# Patient Record
Sex: Female | Born: 2000 | Race: Black or African American | Hispanic: No | Marital: Single | Smoking: Never smoker
Health system: Southern US, Community
[De-identification: ages and names within clinical notes are randomized; demographics above are authoritative.]

## PROBLEM LIST (undated history)

## (undated) ENCOUNTER — Inpatient Hospital Stay (HOSPITAL_COMMUNITY): Payer: Self-pay

## (undated) ENCOUNTER — Ambulatory Visit (HOSPITAL_COMMUNITY): Admission: EM | Payer: Medicaid Other | Source: Home / Self Care

## (undated) ENCOUNTER — Ambulatory Visit (HOSPITAL_COMMUNITY): Payer: Medicaid Other

## (undated) DIAGNOSIS — O139 Gestational [pregnancy-induced] hypertension without significant proteinuria, unspecified trimester: Secondary | ICD-10-CM

## (undated) HISTORY — PX: TONSILLECTOMY: SUR1361

---
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
## 2018-08-27 NOTE — L&D Delivery Note (Signed)
[facility]N [facility] Delivery Note  Tammie Byrd is a 18 y.o. G2P1001 s/p NSVD at [redacted]w[redacted]d. She was admitted for IOL for IUFD.   ROM: 0h 40m with clear fluid GBS Status: unknown Maximum Maternal Temperature: 100.3  Labor Progress: . Admitted for IOL 2/2 IUFD. Patient was given 6105mcg of cytotec at 1738. She then had SROM clear fluid at 1800. Shortly after that she went to use the bathroom and the FOB called out that the baby had been born.   Delivery Date/Time: 07/24/2019 at 1820 Delivery: Called to room and patient had just had NSVD of non-viable female in the bathroom. Cord clamped x 2, and cut by RN. Unable to draw cord blood. Placenta delivered spontaneously with gentle cord traction. Fundus firm with massage and Pitocin. Labia, perineum, vagina, and cervix inspected inspected with no lacerations found.   Placenta: Spontaneous, complete, intact  Complications: IUFD  Lacerations: None  EBL: 100 cc Analgesia: Fentanyl  Postpartum Planning [x]  message to sent to schedule follow-up    Infant: non-viable female  APGARs 0/0  Christiana DNP, CNM  07/24/19  7:17 PM

## 2018-09-14 ENCOUNTER — Emergency Department (HOSPITAL_COMMUNITY)
Admission: EM | Admit: 2018-09-14 | Discharge: 2018-09-14 | Disposition: A | Discharge status: Home / Self Care | Payer: Medicaid Other | Attending: Emergency Medicine | Admitting: Emergency Medicine

## 2018-09-14 ENCOUNTER — Encounter (HOSPITAL_COMMUNITY): Payer: Self-pay | Admitting: Emergency Medicine

## 2018-09-14 DIAGNOSIS — Z79899 Other long term (current) drug therapy: Secondary | ICD-10-CM | POA: Insufficient documentation

## 2018-09-14 DIAGNOSIS — E876 Hypokalemia: Secondary | ICD-10-CM | POA: Diagnosis not present

## 2018-09-14 DIAGNOSIS — F10929 Alcohol use, unspecified with intoxication, unspecified: Secondary | ICD-10-CM | POA: Diagnosis not present

## 2018-09-14 DIAGNOSIS — F10920 Alcohol use, unspecified with intoxication, uncomplicated: Secondary | ICD-10-CM

## 2018-09-14 LAB — CBC WITH DIFFERENTIAL/PLATELET
Abs Immature Granulocytes: 0.08 10*3/uL — ABNORMAL HIGH (ref 0.00–0.07)
BASOS PCT: 0 %
Basophils Absolute: 0 10*3/uL (ref 0.0–0.1)
EOS ABS: 0 10*3/uL (ref 0.0–0.5)
Eosinophils Relative: 0 %
HCT: 39.9 % (ref 36.0–46.0)
Hemoglobin: 11.9 g/dL — ABNORMAL LOW (ref 12.0–15.0)
Immature Granulocytes: 1 %
Lymphocytes Relative: 23 %
Lymphs Abs: 2.4 10*3/uL (ref 0.7–4.0)
MCH: 26.3 pg (ref 26.0–34.0)
MCHC: 29.8 g/dL — ABNORMAL LOW (ref 30.0–36.0)
MCV: 88.3 fL (ref 80.0–100.0)
MONO ABS: 0.8 10*3/uL (ref 0.1–1.0)
MONOS PCT: 8 %
NEUTROS PCT: 68 %
Neutro Abs: 6.8 10*3/uL (ref 1.7–7.7)
PLATELETS: 411 10*3/uL — AB (ref 150–400)
RBC: 4.52 MIL/uL (ref 3.87–5.11)
RDW: 14.8 % (ref 11.5–15.5)
WBC: 10.1 10*3/uL (ref 4.0–10.5)
nRBC: 0 % (ref 0.0–0.2)

## 2018-09-14 LAB — COMPREHENSIVE METABOLIC PANEL
ALT: 22 U/L (ref 0–44)
AST: 21 U/L (ref 15–41)
Albumin: 4.2 g/dL (ref 3.5–5.0)
Alkaline Phosphatase: 89 U/L (ref 38–126)
Anion gap: 13 (ref 5–15)
BUN: 12 mg/dL (ref 6–20)
CHLORIDE: 106 mmol/L (ref 98–111)
CO2: 20 mmol/L — ABNORMAL LOW (ref 22–32)
Calcium: 8.5 mg/dL — ABNORMAL LOW (ref 8.9–10.3)
Creatinine, Ser: 0.8 mg/dL (ref 0.44–1.00)
Glucose, Bld: 129 mg/dL — ABNORMAL HIGH (ref 70–99)
POTASSIUM: 2.5 mmol/L — AB (ref 3.5–5.1)
Sodium: 139 mmol/L (ref 135–145)
Total Bilirubin: 0.4 mg/dL (ref 0.3–1.2)
Total Protein: 9.1 g/dL — ABNORMAL HIGH (ref 6.5–8.1)

## 2018-09-14 LAB — ACETAMINOPHEN LEVEL

## 2018-09-14 LAB — SALICYLATE LEVEL

## 2018-09-14 LAB — ETHANOL: ALCOHOL ETHYL (B): 257 mg/dL — AB (ref <10)

## 2018-09-14 MED ORDER — POTASSIUM CHLORIDE CRYS ER 20 MEQ PO TBCR: 40.0000 meq | EXTENDED_RELEASE_TABLET | Freq: Once | ORAL | Status: DC

## 2018-09-14 MED ORDER — SODIUM CHLORIDE 0.9 % IV BOLUS
1000.0000 mL | Freq: Once | INTRAVENOUS | Status: AC
  Administered 2018-09-14: 1000 mL via INTRAVENOUS

## 2018-09-14 MED ORDER — ONDANSETRON HCL 4 MG/2ML IJ SOLN
4.0000 mg | Freq: Once | INTRAMUSCULAR | Status: AC
  Administered 2018-09-14: 4 mg via INTRAVENOUS
  Filled 2018-09-14: qty 2

## 2018-09-14 MED ORDER — POTASSIUM CHLORIDE ER 10 MEQ PO TBCR: 10.0000 meq | EXTENDED_RELEASE_TABLET | Freq: Every day | ORAL | 0 refills | Status: DC

## 2018-09-14 NOTE — ED Notes (Signed)
Pt left without being D/C, her AVS/ prescription, being able to update vitals, or signing.
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
## 2018-09-14 NOTE — ED Notes (Addendum)
Pt not in room.  Sitter for another pt states pt was on the other side of the ED asking for apple juice.
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
## 2018-09-14 NOTE — Discharge Instructions (Signed)
Do not drink alcohol in excess as you did last night. This is very dangerous.   Your blood work showed that your potassium is low. Take potassium supplement daily. Call your primary care doctor tomorrow morning to schedule a follow up appointment in 1 week for repeat labs.   Return to ER for new or worsening symptoms, any additional concerns.
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
## 2018-09-14 NOTE — ED Notes (Signed)
Pt now awake and alert.  Pt ambulated to bathroom.  In bathroom pt pulled off her gown and pulled out her IV.  Pt taken back to room, and dressed in clean gown.  PA called to talk to pt.  Pt agreed to stay until a sober ride arrives.

## 2018-09-14 NOTE — ED Notes (Signed)
Pt came out of room , stating she called her ride.  2-3 minutes later [place] PA came to ask if her ride was here. pt was then asleep in the bed.

## 2019-03-17 IMAGING — US US MFM OB FOLLOW-UP
1 series · 13 of 28 positions shown · non-contrast
Comparison: none

[Series 1: us mfm ob follow-up · 13 of 46 slices shown]
[im 2/46]
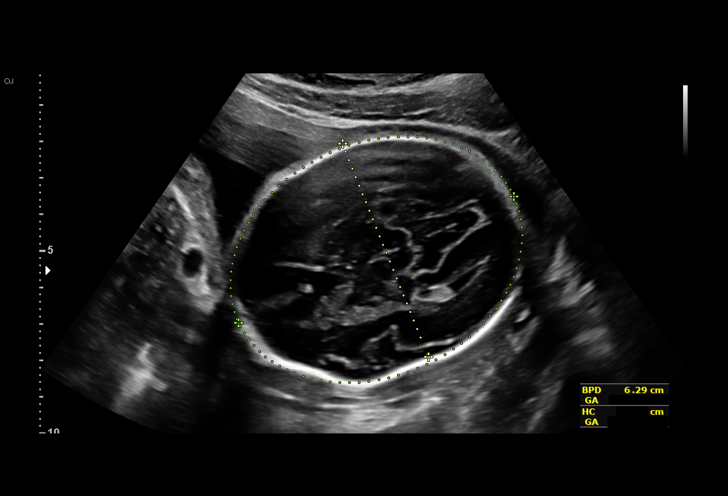
[im 6/46]
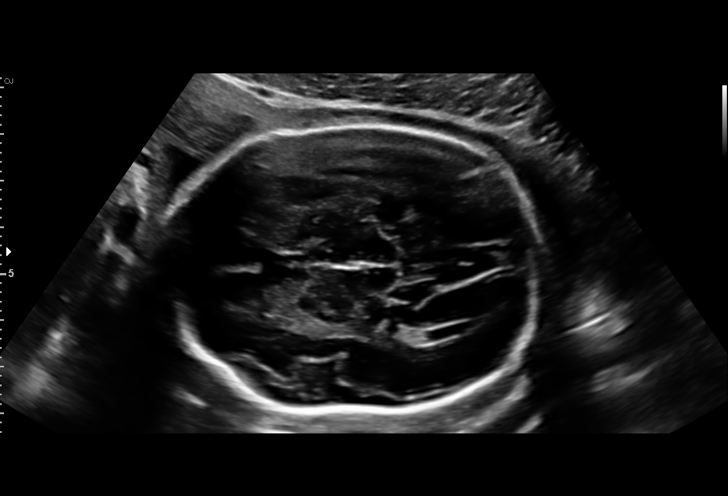
[im 9/46]
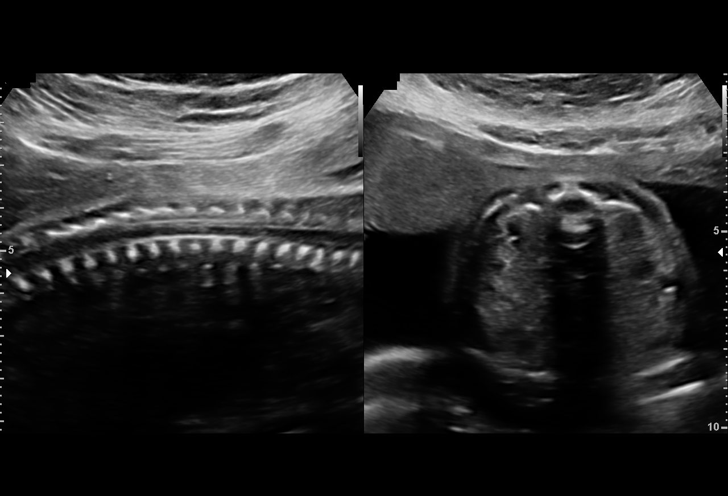
[im 12/46]
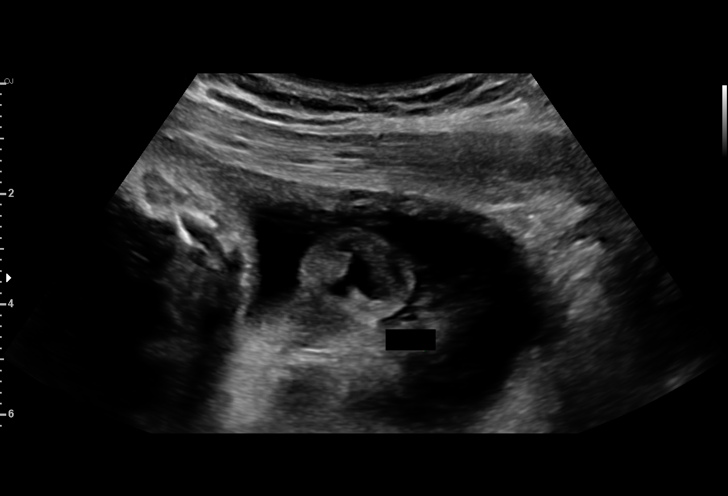
[im 16/46]
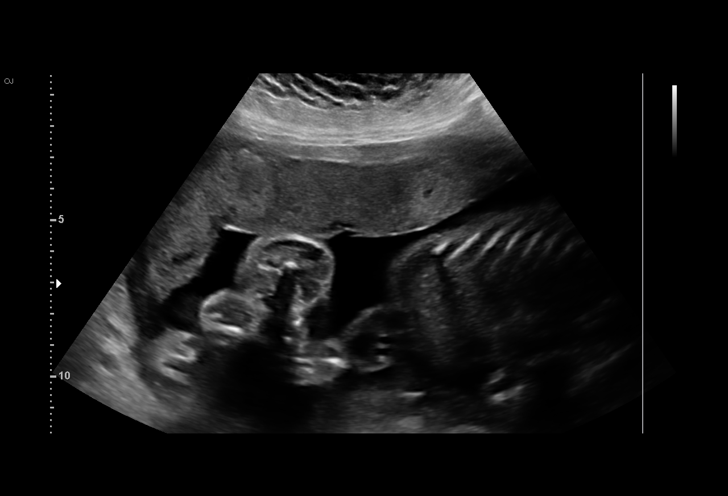
[im 19/46]
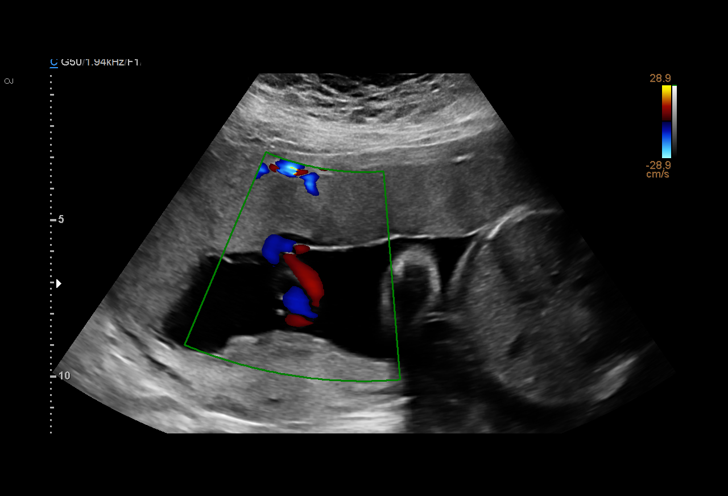
[im 24/46]
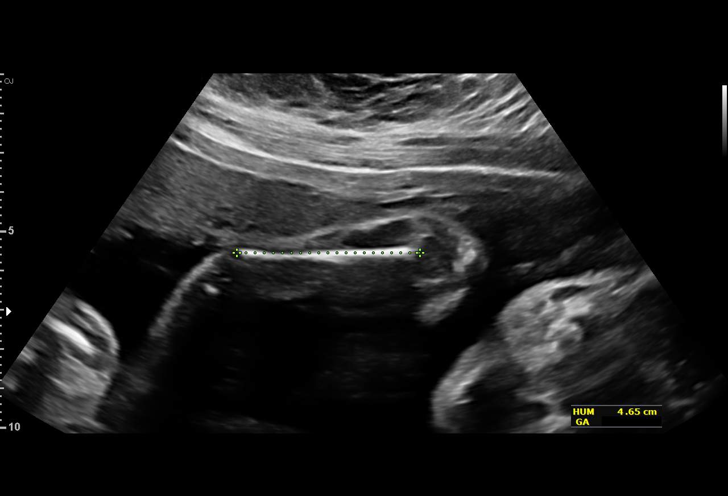
[im 27/46]
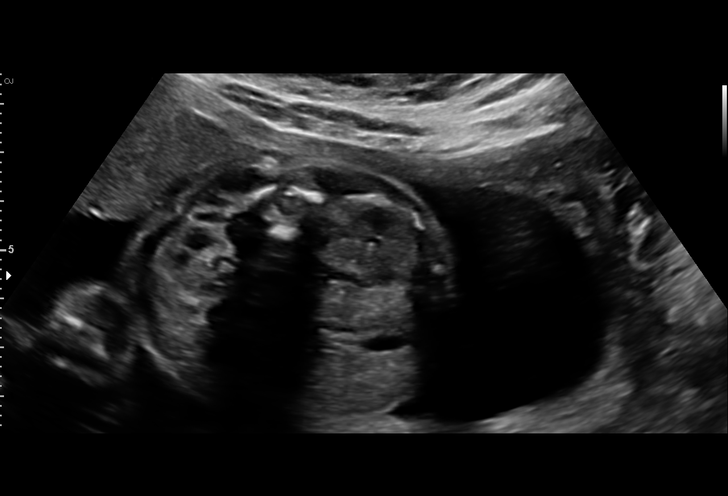
[im 31/46]
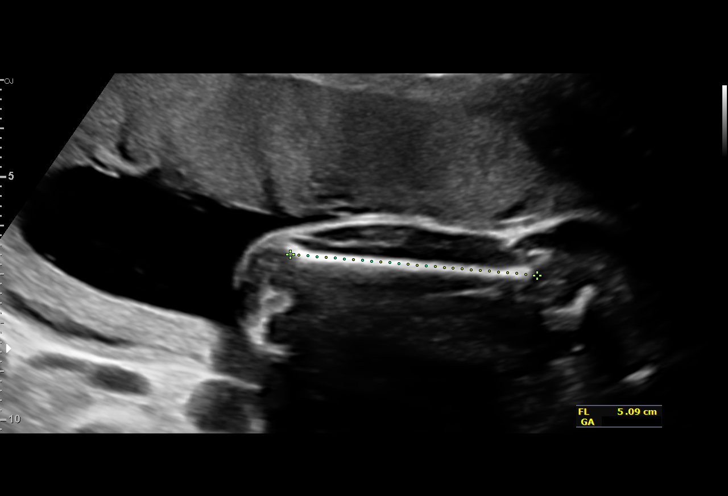
[im 34/46]
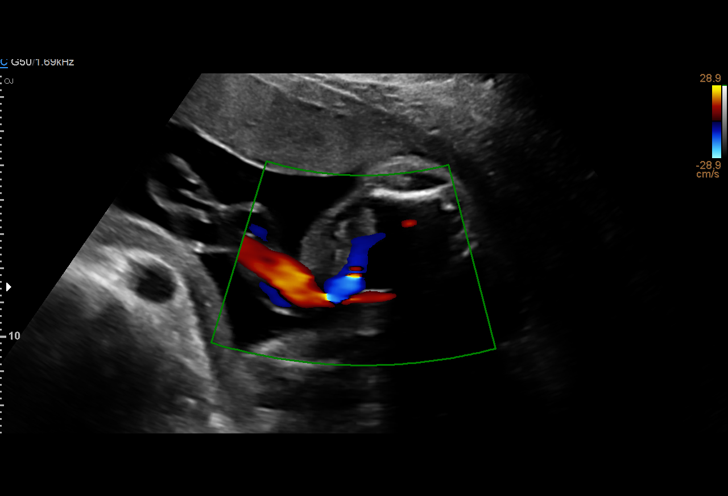
[im 37/46]
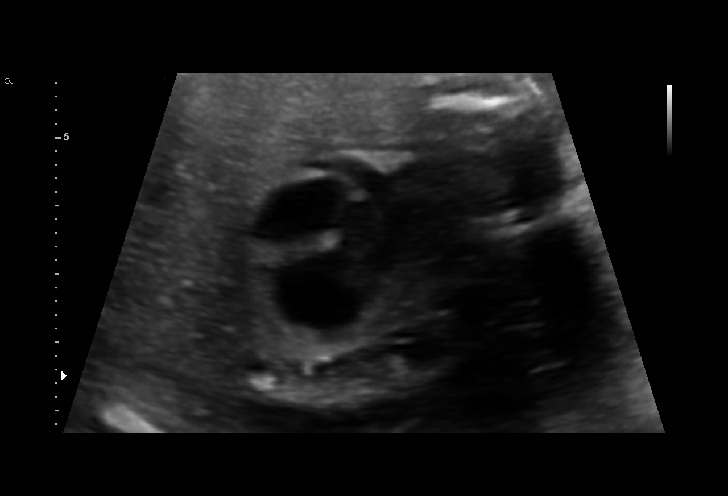
[im 41/46]
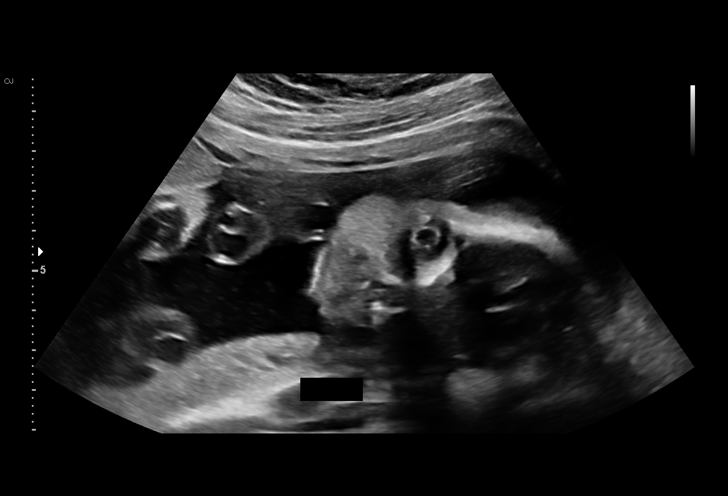
[im 44/46]
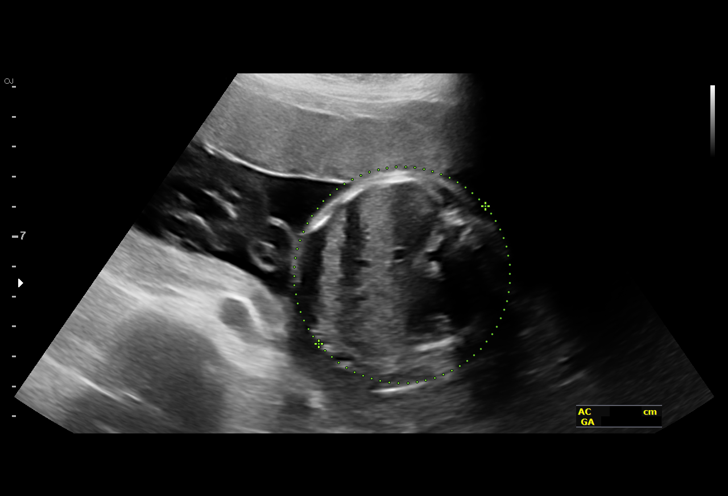

[13 of 28 positions shown; findings below may reference images not displayed]

Road [HOSPITAL]

1  KITTY LYDIA            [id number]      [id number]     [id number]
Indications

25 weeks gestation of pregnancy
Teen pregnancy
Late prenatal care, second trimester
Encephalocele
OB History

Blood Type:            Height:  5'5"   Weight (lb):  189      BMI:
Gravidity:    1
Fetal Evaluation

Num Of Fetuses:     1
Fetal Heart         133
Rate(bpm):
Cardiac Activity:   Observed
Presentation:       Cephalic
Placenta:           Anterior, above cervical os
P. Cord Insertion:  Previously Visualized

Amniotic Fluid
AFI FV:      Subjectively within normal limits

Largest Pocket(cm)
4.44
Biometry

BPD:      61.2  mm     G. Age:  24w 6d         15  %    CI:         71.7   %   70 - 86
FL/HC:      22.0   %   18.6 -
HC:      230.1  mm     G. Age:  25w 0d         10  %    HC/AC:      1.06       1.04 -
AC:      216.1  mm     G. Age:  26w 1d         53  %    FL/BPD:     82.7   %   71 - 87
FL:       50.6  mm     G. Age:  27w 1d         78  %    FL/AC:      23.4   %   20 - 24
HUM:      46.2  mm     G. Age:  27w 2d         81  %

Est. FW:     917  gm          2 lb      65  %
Gestational Age

LMP:           30w 0d       Date:   07/02/16                 EDD:   04/08/17
Clinical EDD:  26w 3d                                        EDD:   05/03/17
U/S Today:     25w 6d                                        EDD:   05/07/17
Best:          25w 5d    Det. By:   U/S  (12/31/16)          EDD:   05/08/17
Anatomy

Cranium:               Deoja sign             Aortic Arch:            Appears normal
Cavum:                 Appears normal         Ductal Arch:            Not well visualized
Ventricles:            Appears normal         Diaphragm:              Appears normal
Choroid Plexus:        Previously seen        Stomach:                Appears normal, left
sided
Cerebellum:            Appears normal         Abdomen:                Appears normal
Posterior Fossa:       Appears normal         Abdominal Wall:         Appears nml (cord
insert, abd wall)
Nuchal Fold:           Previously seen        Cord Vessels:           Appears normal (3
vessel cord)
Face:                  Orbits and profile     Kidneys:                Appear normal
previously seen
Lips:                  Appears normal         Bladder:                Appears normal
Thoracic:              Appears normal         Spine:                  Appears normal
Heart:                 Appears normal         Upper Extremities:      Previously seen
(4CH, axis, and
situs)
RVOT:                  Previously seen        Lower Extremities:      Previously seen
LVOT:                  Previously seen

Other:  Fetus appears to be a male. Technically difficult due to fetal position.
Cervix Uterus Adnexa

Cervix
Length:           3.24  cm.
Normal appearance by transabdominal scan.

Uterus
No abnormality visualized.

Left Ovary
Not visualized. No adnexal mass visualized.

Right Ovary
Not visualized. No adnexal mass visualized.
Impression

Single IUP at 25w 5d
Follow up due to posterior encephalocele
Deoja sign noted
The remainder of the cranial anatomy appears normal
Unable to visualize the encephalocele on today's study due to
fetal position.  Refused transvaginal ultrasound.
Fetal growth is appropriate (65th %tile)
Anterior placenta without previa
Normal amniotic fluid volume
Recommendations

Ultrasound for growth in 4 weeks
Patient and mother expressed interest in transferring care to
[place] for delivery - arrangements being made to facilitate.

## 2019-03-23 ENCOUNTER — Encounter (HOSPITAL_COMMUNITY): Payer: Self-pay | Admitting: *Deleted

## 2019-03-23 ENCOUNTER — Other Ambulatory Visit: Payer: Self-pay

## 2019-03-23 ENCOUNTER — Inpatient Hospital Stay (HOSPITAL_COMMUNITY)
Admission: AD | Admit: 2019-03-23 | Discharge: 2019-03-23 | Discharge status: Against Medical Advice | Payer: Medicaid Other | Attending: Obstetrics and Gynecology | Admitting: Obstetrics and Gynecology

## 2019-03-23 DIAGNOSIS — Z5321 Procedure and treatment not carried out due to patient leaving prior to being seen by health care provider: Secondary | ICD-10-CM | POA: Diagnosis not present

## 2019-03-23 DIAGNOSIS — O210 Mild hyperemesis gravidarum: Secondary | ICD-10-CM | POA: Insufficient documentation

## 2019-03-23 DIAGNOSIS — Z3A15 15 weeks gestation of pregnancy: Secondary | ICD-10-CM | POA: Insufficient documentation

## 2019-03-23 NOTE — MAU Note (Signed)
Is pregnant, thanks she is 3 months preg.  Hasn't been seen yet. Drank cranberry juice last night, threw up this morning and she thinks there was blood in it.  Hasn't had an [place], thinks she needs one..   Is still feeling the baby move. Denies pain or vag bleeding.

## 2019-03-23 NOTE — MAU Note (Signed)
PT came to the desk stating that she was hungry and that she would have to come back another day. Pt walked off the unit

## 2019-06-06 ENCOUNTER — Inpatient Hospital Stay (HOSPITAL_COMMUNITY)
Admission: AD | Admit: 2019-06-06 | Discharge: 2019-06-06 | Disposition: A | Discharge status: Home / Self Care | Payer: Medicaid Other | Attending: Obstetrics & Gynecology | Admitting: Obstetrics & Gynecology

## 2019-06-06 ENCOUNTER — Encounter (HOSPITAL_COMMUNITY): Payer: Self-pay

## 2019-06-06 ENCOUNTER — Other Ambulatory Visit: Payer: Self-pay

## 2019-06-06 DIAGNOSIS — O0932 Supervision of pregnancy with insufficient antenatal care, second trimester: Secondary | ICD-10-CM | POA: Insufficient documentation

## 2019-06-06 DIAGNOSIS — F5089 Other specified eating disorder: Secondary | ICD-10-CM | POA: Diagnosis not present

## 2019-06-06 DIAGNOSIS — Z3A25 25 weeks gestation of pregnancy: Secondary | ICD-10-CM | POA: Insufficient documentation

## 2019-06-06 DIAGNOSIS — O99019 Anemia complicating pregnancy, unspecified trimester: Secondary | ICD-10-CM

## 2019-06-06 DIAGNOSIS — O26892 Other specified pregnancy related conditions, second trimester: Secondary | ICD-10-CM | POA: Insufficient documentation

## 2019-06-06 HISTORY — DX: Gestational (pregnancy-induced) hypertension without significant proteinuria, unspecified trimester: O13.9

## 2019-06-06 LAB — WET PREP, GENITAL
Clue Cells Wet Prep HPF POC: NONE SEEN
Sperm: NONE SEEN
Trich, Wet Prep: NONE SEEN
WBC, Wet Prep HPF POC: NONE SEEN
Yeast Wet Prep HPF POC: NONE SEEN

## 2019-06-06 LAB — URINALYSIS, ROUTINE W REFLEX MICROSCOPIC
Bacteria, UA: NONE SEEN
Bilirubin Urine: NEGATIVE
Glucose, UA: NEGATIVE mg/dL
Hgb urine dipstick: NEGATIVE
Ketones, ur: NEGATIVE mg/dL
Nitrite: NEGATIVE
Protein, ur: 30 mg/dL — AB
Specific Gravity, Urine: 1.03 (ref 1.005–1.030)
pH: 6 (ref 5.0–8.0)

## 2019-06-06 LAB — CBC WITH DIFFERENTIAL/PLATELET
Abs Immature Granulocytes: 0.08 10*3/uL — ABNORMAL HIGH (ref 0.00–0.07)
Basophils Absolute: 0 10*3/uL (ref 0.0–0.1)
Basophils Relative: 0 %
Eosinophils Absolute: 0.2 10*3/uL (ref 0.0–0.5)
Eosinophils Relative: 2 %
HCT: 29.6 % — ABNORMAL LOW (ref 36.0–46.0)
Hemoglobin: 9.2 g/dL — ABNORMAL LOW (ref 12.0–15.0)
Immature Granulocytes: 1 %
Lymphocytes Relative: 21 %
Lymphs Abs: 2.1 10*3/uL (ref 0.7–4.0)
MCH: 25.1 pg — ABNORMAL LOW (ref 26.0–34.0)
MCHC: 31.1 g/dL (ref 30.0–36.0)
MCV: 80.7 fL (ref 80.0–100.0)
Monocytes Absolute: 0.5 10*3/uL (ref 0.1–1.0)
Monocytes Relative: 5 %
Neutro Abs: 7.4 10*3/uL (ref 1.7–7.7)
Neutrophils Relative %: 71 %
Platelets: 311 10*3/uL (ref 150–400)
RBC: 3.67 MIL/uL — ABNORMAL LOW (ref 3.87–5.11)
RDW: 14.3 % (ref 11.5–15.5)
WBC: 10.3 10*3/uL (ref 4.0–10.5)
nRBC: 0 % (ref 0.0–0.2)

## 2019-06-06 LAB — COMPREHENSIVE METABOLIC PANEL
ALT: 12 U/L (ref 0–44)
AST: 15 U/L (ref 15–41)
Albumin: 2.6 g/dL — ABNORMAL LOW (ref 3.5–5.0)
Alkaline Phosphatase: 71 U/L (ref 38–126)
Anion gap: 9 (ref 5–15)
BUN: <5 mg/dL — ABNORMAL LOW (ref 6–20)
CO2: 20 mmol/L — ABNORMAL LOW (ref 22–32)
Calcium: 8.6 mg/dL — ABNORMAL LOW (ref 8.9–10.3)
Chloride: 104 mmol/L (ref 98–111)
Creatinine, Ser: 0.61 mg/dL (ref 0.44–1.00)
GFR calc Af Amer: >60 mL/min (ref >60)
GFR calc non Af Amer: >60 mL/min (ref >60)
Glucose, Bld: 98 mg/dL (ref 70–99)
Potassium: 3.4 mmol/L — ABNORMAL LOW (ref 3.5–5.1)
Sodium: 133 mmol/L — ABNORMAL LOW (ref 135–145)
Total Bilirubin: 0.3 mg/dL (ref 0.3–1.2)
Total Protein: 6.8 g/dL (ref 6.5–8.1)

## 2019-06-06 NOTE — MAU Provider Note (Signed)
History     CSN: [id number]  Arrival date and time: 06/06/19 1118   First Provider Initiated Contact with Patient 06/06/19 1223      No chief complaint on file.  HPI   [name] is a 18 y.o. female G2P1001 @ [redacted]w[redacted]d no prental care,  here in MAU with complaints of stiffness in her bilateral thighs. The symptoms have been present for more than 4 weeks. The symptoms come and go and last throughout the day. She has not started prenatal care. + fetal movement. She attests to Pica throughout the entire pregnancy. Says she has been eating carpet. She says initially she was eating 15 pieces of carpet every single day. No she is not eating any carpet because she is concerned it will hurt the baby. She has been doing this since she was 18 y/o.  OB History    Gravida  2   Para  1   Term  1   Preterm      AB      Living  1     SAB      TAB      Ectopic      Multiple      Live Births  1           Past Medical History:  Diagnosis Date  . Pregnancy induced hypertension     Past Surgical History:  Procedure Laterality Date  . TONSILLECTOMY      Family History  Problem Relation Age of Onset  . Hypertension Father   . Cancer Brother   . Early death Brother   . Cancer Maternal Grandmother   . Diabetes Maternal Grandfather   . Hypertension Maternal Grandfather     Social History   Tobacco Use  . Smoking status: Never Smoker  . Smokeless tobacco: Never Used  Substance Use Topics  . Alcohol use: No  . Drug use: No    Allergies: No Known Allergies  Medications Prior to Admission  Medication Sig Dispense Refill Last Dose  . metroNIDAZOLE (FLAGYL) 500 MG tablet Take two tablets by mouth twice a day, for one day.  Or you can take all four tablets at once if you can tolerate it. (Patient not taking: Reported on 01/28/2017) 4 tablet 0   . nitrofurantoin, macrocrystal-monohydrate, (MACROBID) 100 MG capsule Take 1 capsule (100 mg total) by mouth 2 (two) times  daily. (Patient not taking: Reported on 01/28/2017) 14 capsule 0   . potassium chloride (K-DUR) 10 MEQ tablet Take 1 tablet (10 mEq total) by mouth daily. 7 tablet 0   . Prenat-FeAsp-Meth-FA-DHA w/o A (PRENATE PIXIE) 10-0.6-0.4-200 MG CAPS Take 1 tablet by mouth daily. 30 capsule 12    Results for orders placed or performed during the hospital encounter of 06/06/19 (from the past 48 hour(s))  Urinalysis, Routine w reflex microscopic     Status: Abnormal   Collection Time: 06/06/19 12:00 PM  Result Value Ref Range   Color, Urine YELLOW YELLOW   APPearance HAZY (A) CLEAR   Specific Gravity, Urine 1.030 1.005 - 1.030   pH 6.0 5.0 - 8.0   Glucose, UA NEGATIVE NEGATIVE mg/dL   Hgb urine dipstick NEGATIVE NEGATIVE   Bilirubin Urine NEGATIVE NEGATIVE   Ketones, ur NEGATIVE NEGATIVE mg/dL   Protein, ur 30 (A) NEGATIVE mg/dL   Nitrite NEGATIVE NEGATIVE   Leukocytes,Ua MODERATE (A) NEGATIVE   RBC / HPF 0-5 0 - 5 RBC/hpf   WBC, UA 0-5 0 - 5 WBC/hpf  Bacteria, UA NONE SEEN NONE SEEN   Squamous Epithelial / LPF 6-10 0 - 5   Mucus PRESENT     Comment: Performed at [facility], [address] [place], [place] [address]  CBC with Differential/Platelet     Status: Abnormal   Collection Time: 06/06/19 12:49 PM  Result Value Ref Range   WBC 10.3 4.0 - 10.5 K/uL   RBC 3.67 (L) 3.87 - 5.11 MIL/uL   Hemoglobin 9.2 (L) 12.0 - 15.0 g/dL   HCT 97.3 (L) 53.2 - 99.2 %   MCV 80.7 80.0 - 100.0 fL   MCH 25.1 (L) 26.0 - 34.0 pg   MCHC 31.1 30.0 - 36.0 g/dL   RDW 42.6 83.4 - 19.6 %   Platelets 311 150 - 400 K/uL   nRBC 0.0 0.0 - 0.2 %   Neutrophils Relative % 71 %   Neutro Abs 7.4 1.7 - 7.7 K/uL   Lymphocytes Relative 21 %   Lymphs Abs 2.1 0.7 - 4.0 K/uL   Monocytes Relative 5 %   Monocytes Absolute 0.5 0.1 - 1.0 K/uL   Eosinophils Relative 2 %   Eosinophils Absolute 0.2 0.0 - 0.5 K/uL   Basophils Relative 0 %   Basophils Absolute 0.0 0.0 - 0.1 K/uL   Immature Granulocytes 1 %   Abs Immature  Granulocytes 0.08 (H) 0.00 - 0.07 K/uL    Comment: Performed at [facility], [address], [place], [place] [address]  Comprehensive metabolic panel     Status: Abnormal   Collection Time: 06/06/19 12:49 PM  Result Value Ref Range   Sodium 133 (L) 135 - 145 mmol/L   Potassium 3.4 (L) 3.5 - 5.1 mmol/L   Chloride 104 98 - 111 mmol/L   CO2 20 (L) 22 - 32 mmol/L   Glucose, Bld 98 70 - 99 mg/dL   BUN <5 (L) 6 - 20 mg/dL   Creatinine, Ser 9.89 0.44 - 1.00 mg/dL   Calcium 8.6 (L) 8.9 - 10.3 mg/dL   Total Protein 6.8 6.5 - 8.1 g/dL   Albumin 2.6 (L) 3.5 - 5.0 g/dL   AST 15 15 - 41 U/L   ALT 12 0 - 44 U/L   Alkaline Phosphatase 71 38 - 126 U/L   Total Bilirubin 0.3 0.3 - 1.2 mg/dL   GFR calc non Af Amer >60 >60 mL/min   GFR calc Af Amer >60 >60 mL/min   Anion gap 9 5 - 15    Comment: Performed at [facility], [address], [place], [place] [address]   Review of Systems  Constitutional: Negative for fever.  Gastrointestinal: Negative for abdominal pain.  Musculoskeletal: Positive for myalgias.   Physical Exam   Blood pressure 111/61, pulse (!) 117, temperature 98.4 F (36.9 C), temperature source Oral, resp. rate 18, height 5\' 5"  (1.651 m), weight 95.6 kg, last menstrual period 12/07/2018, SpO2 97 %, unknown if currently breastfeeding.  Physical Exam  Constitutional: She is oriented to person, place, and time. She appears well-developed and well-nourished. No distress.  GI: Soft. She exhibits no distension. There is no abdominal tenderness. There is no rebound.  Musculoskeletal:     Right shoulder: She exhibits normal range of motion and no swelling.     Left hip: She exhibits normal range of motion, normal strength and no tenderness.  Neurological: She is alert and oriented to person, place, and time. Coordination normal.  Skin: Skin is warm. She is not diaphoretic.  Psychiatric: Her behavior is normal.   Fetal Tracing: Baseline: 140 bpm Variability: Moderate   Accelerations: 10x10 Decelerations: None Toco: None   MAU Course  Procedures  None  MDM  Patient requesting all STD testing today CBC & CMP today UA and Urine culture Discussed the importance of starting prenatal care   Assessment and Plan   A:  1. Pica   2. No prenatal care in current pregnancy in second trimester   3. [redacted] weeks gestation of pregnancy     P:  Discussed the harm of PICA and non food consumption Call [facility] on Monday to schedule appointment US scheduled outpatient.  Continue prenatal vitamins  Start Iron daily, RX sent  , Harolyn RutherfordJennifer I, NP 06/06/2019 2:23 PM

## 2019-06-06 NOTE — MAU Note (Signed)
Tammie Byrd is a 18 y.o. at [redacted]w[redacted]d here in MAU reporting: states her body has been stiff for a month, mostly in her thighs and lower abdomen. States pain is intermittent, feels the pain mostly with movement or when she is walking. No vaginal bleeding, LOF, or abnormal discharge. States she has not started [facility], is thinking that maybe she will. Would like to find out the gender.  Onset of complaint: ongoing  Pain score: 7/10  Vitals:   06/06/19 1138  BP: (!) 112/55  Pulse: (!) 108  Resp: 18  Temp: 98.4 F (36.9 C)  SpO2: 97%     FHT: +FM  Lab orders placed from triage: UA

## 2019-06-07 LAB — CULTURE, OB URINE: Special Requests: NORMAL

## 2019-06-08 ENCOUNTER — Telehealth: Payer: Self-pay | Admitting: Medical

## 2019-06-08 NOTE — Telephone Encounter (Signed)
Opened in error

## 2019-06-15 ENCOUNTER — Other Ambulatory Visit: Payer: Self-pay | Admitting: Obstetrics and Gynecology

## 2019-06-15 ENCOUNTER — Telehealth: Payer: Self-pay | Admitting: Student

## 2019-06-15 LAB — GC/CHLAMYDIA PROBE AMP (~~LOC~~) NOT AT ARMC
Chlamydia: POSITIVE — AB
Comment: NEGATIVE
Comment: NORMAL
Neisseria Gonorrhea: NEGATIVE

## 2019-06-15 MED ORDER — AZITHROMYCIN 500 MG PO TABS: 1000.0000 mg | ORAL_TABLET | Freq: Once | ORAL | 0 refills | Status: AC

## 2019-06-15 NOTE — Telephone Encounter (Addendum)
Tammie Byrd tested positive for  Chlamydia. Patient was called by RN and allergies and pharmacy confirmed. Rx sent to pharmacy of choice.   [name], NP 06/15/2019 4:49 PM       ----- Message from Bjorn Loser, RN sent at 06/15/2019  2:50 PM EDT ----- This patient tested positive for :  chlamydia  She :"has NKDA", I have informed the patient of her results and confirmed her pharmacy is correct in her chart. Please send Rx.   Thank you,   Bjorn Loser, RN   Results faxed to [facility] Department.

## 2019-06-15 NOTE — Progress Notes (Signed)
+   chlamydia Rx: Azithromycin   Raife Lizer, Artist Pais, NP 06/15/2019 3:36 PM

## 2019-06-25 ENCOUNTER — Other Ambulatory Visit: Payer: Self-pay | Admitting: Obstetrics and Gynecology

## 2019-06-25 MED ORDER — AZITHROMYCIN 500 MG PO TABS: 1000.0000 mg | ORAL_TABLET | Freq: Once | ORAL | 0 refills | Status: AC

## 2019-06-25 NOTE — Progress Notes (Signed)
+   chlamydia. Rx for Azithromycin sent   Rasch, Artist Pais, NP 06/25/2019 10:24 AM

## 2019-07-10 ENCOUNTER — Encounter (HOSPITAL_COMMUNITY): Payer: Self-pay

## 2019-07-10 ENCOUNTER — Other Ambulatory Visit: Payer: Self-pay

## 2019-07-10 ENCOUNTER — Ambulatory Visit (HOSPITAL_COMMUNITY)
Admission: RE | Admit: 2019-07-10 | Discharge: 2019-07-10 | Disposition: A | Discharge status: Home / Self Care | Payer: Medicaid Other | Source: Ambulatory Visit | Attending: Obstetrics and Gynecology | Admitting: Obstetrics and Gynecology

## 2019-07-10 ENCOUNTER — Ambulatory Visit (HOSPITAL_COMMUNITY): Payer: Medicaid Other | Admitting: *Deleted

## 2019-07-10 VITALS — BP 127/77 | HR 91 | Temp 97.5°F

## 2019-07-10 DIAGNOSIS — O0932 Supervision of pregnancy with insufficient antenatal care, second trimester: Secondary | ICD-10-CM | POA: Diagnosis present

## 2019-07-10 DIAGNOSIS — O352XX0 Maternal care for (suspected) hereditary disease in fetus, not applicable or unspecified: Secondary | ICD-10-CM | POA: Diagnosis not present

## 2019-07-10 DIAGNOSIS — O0990 Supervision of high risk pregnancy, unspecified, unspecified trimester: Secondary | ICD-10-CM | POA: Insufficient documentation

## 2019-07-10 DIAGNOSIS — F5089 Other specified eating disorder: Secondary | ICD-10-CM

## 2019-07-10 DIAGNOSIS — Z3A31 31 weeks gestation of pregnancy: Secondary | ICD-10-CM | POA: Diagnosis not present

## 2019-07-10 DIAGNOSIS — O0933 Supervision of pregnancy with insufficient antenatal care, third trimester: Secondary | ICD-10-CM

## 2019-07-10 DIAGNOSIS — Z3A25 25 weeks gestation of pregnancy: Secondary | ICD-10-CM | POA: Insufficient documentation

## 2019-07-20 ENCOUNTER — Other Ambulatory Visit (HOSPITAL_COMMUNITY): Payer: Self-pay | Admitting: *Deleted

## 2019-07-20 DIAGNOSIS — Z3689 Encounter for other specified antenatal screening: Secondary | ICD-10-CM

## 2019-07-24 ENCOUNTER — Encounter (HOSPITAL_COMMUNITY): Payer: Self-pay | Admitting: Obstetrics

## 2019-07-24 ENCOUNTER — Inpatient Hospital Stay (HOSPITAL_COMMUNITY)
Admission: AD | Admit: 2019-07-24 | Discharge: 2019-07-25 | DRG: 805 | Disposition: A | Discharge status: Home / Self Care | Payer: Medicaid Other | Attending: Obstetrics and Gynecology | Admitting: Obstetrics and Gynecology

## 2019-07-24 ENCOUNTER — Other Ambulatory Visit: Payer: Self-pay

## 2019-07-24 ENCOUNTER — Inpatient Hospital Stay (HOSPITAL_COMMUNITY): Payer: Medicaid Other

## 2019-07-24 DIAGNOSIS — O0932 Supervision of pregnancy with insufficient antenatal care, second trimester: Secondary | ICD-10-CM

## 2019-07-24 DIAGNOSIS — Z3A33 33 weeks gestation of pregnancy: Secondary | ICD-10-CM

## 2019-07-24 DIAGNOSIS — F5089 Other specified eating disorder: Secondary | ICD-10-CM

## 2019-07-24 DIAGNOSIS — O403XX0 Polyhydramnios, third trimester, not applicable or unspecified: Secondary | ICD-10-CM | POA: Diagnosis present

## 2019-07-24 DIAGNOSIS — O364XX0 Maternal care for intrauterine death, not applicable or unspecified: Principal | ICD-10-CM | POA: Diagnosis present

## 2019-07-24 DIAGNOSIS — O98319 Other infections with a predominantly sexual mode of transmission complicating pregnancy, unspecified trimester: Secondary | ICD-10-CM | POA: Diagnosis present

## 2019-07-24 DIAGNOSIS — Z20828 Contact with and (suspected) exposure to other viral communicable diseases: Secondary | ICD-10-CM | POA: Diagnosis present

## 2019-07-24 DIAGNOSIS — O411230 Chorioamnionitis, third trimester, not applicable or unspecified: Secondary | ICD-10-CM | POA: Diagnosis present

## 2019-07-24 DIAGNOSIS — O411290 Chorioamnionitis, unspecified trimester, not applicable or unspecified: Secondary | ICD-10-CM

## 2019-07-24 DIAGNOSIS — A64 Unspecified sexually transmitted disease: Secondary | ICD-10-CM | POA: Diagnosis present

## 2019-07-24 LAB — URINALYSIS, ROUTINE W REFLEX MICROSCOPIC
Bilirubin Urine: NEGATIVE
Glucose, UA: NEGATIVE mg/dL
Hgb urine dipstick: NEGATIVE
Ketones, ur: 5 mg/dL — AB
Nitrite: NEGATIVE
Protein, ur: 30 mg/dL — AB
Specific Gravity, Urine: 1.019 (ref 1.005–1.030)
pH: 6 (ref 5.0–8.0)

## 2019-07-24 LAB — CBC
HCT: 34 % — ABNORMAL LOW (ref 36.0–46.0)
Hemoglobin: 10.5 g/dL — ABNORMAL LOW (ref 12.0–15.0)
MCH: 23.8 pg — ABNORMAL LOW (ref 26.0–34.0)
MCHC: 30.9 g/dL (ref 30.0–36.0)
MCV: 76.9 fL — ABNORMAL LOW (ref 80.0–100.0)
Platelets: 328 10*3/uL (ref 150–400)
RBC: 4.42 MIL/uL (ref 3.87–5.11)
RDW: 15.3 % (ref 11.5–15.5)
WBC: 13.1 10*3/uL — ABNORMAL HIGH (ref 4.0–10.5)
nRBC: 0 % (ref 0.0–0.2)

## 2019-07-24 LAB — ABO/RH: ABO/RH(D): O POS

## 2019-07-24 LAB — TYPE AND SCREEN
ABO/RH(D): O POS
Antibody Screen: NEGATIVE

## 2019-07-24 LAB — HEPATITIS B SURFACE ANTIGEN: Hepatitis B Surface Ag: NONREACTIVE

## 2019-07-24 LAB — SARS CORONAVIRUS 2 (TAT 6-24 HRS): SARS Coronavirus 2: NEGATIVE

## 2019-07-24 LAB — HIV ANTIBODY (ROUTINE TESTING W REFLEX): HIV Screen 4th Generation wRfx: NONREACTIVE

## 2019-07-24 MED ORDER — WITCH HAZEL-GLYCERIN EX PADS: 1.0000 "application " | MEDICATED_PAD | CUTANEOUS | Status: DC | PRN

## 2019-07-24 MED ORDER — DIBUCAINE (PERIANAL) 1 % EX OINT: 1.0000 "application " | TOPICAL_OINTMENT | CUTANEOUS | Status: DC | PRN

## 2019-07-24 MED ORDER — OXYTOCIN 40 UNITS IN NORMAL SALINE INFUSION - SIMPLE MED
2.5000 [IU]/h | INTRAVENOUS | Status: DC
  Filled 2019-07-24: qty 1000

## 2019-07-24 MED ORDER — ONDANSETRON HCL 4 MG PO TABS: 4.0000 mg | ORAL_TABLET | ORAL | Status: DC | PRN

## 2019-07-24 MED ORDER — ACETAMINOPHEN 325 MG PO TABS: 650.0000 mg | ORAL_TABLET | ORAL | Status: DC | PRN

## 2019-07-24 MED ORDER — IBUPROFEN 600 MG PO TABS
600.0000 mg | ORAL_TABLET | Freq: Four times a day (QID) | ORAL | Status: DC
  Administered 2019-07-24 – 2019-07-25 (×2): 600 mg via ORAL
  Filled 2019-07-24 (×3): qty 1

## 2019-07-24 MED ORDER — MISOPROSTOL 200 MCG PO TABS
600.0000 ug | ORAL_TABLET | Freq: Once | ORAL | Status: AC
  Administered 2019-07-24: 600 ug via BUCCAL
  Filled 2019-07-24: qty 3

## 2019-07-24 MED ORDER — SOD CITRATE-CITRIC ACID 500-334 MG/5ML PO SOLN: 30.0000 mL | ORAL | Status: DC | PRN

## 2019-07-24 MED ORDER — OXYCODONE-ACETAMINOPHEN 5-325 MG PO TABS: 1.0000 | ORAL_TABLET | ORAL | Status: DC | PRN

## 2019-07-24 MED ORDER — DIPHENHYDRAMINE HCL 25 MG PO CAPS: 25.0000 mg | ORAL_CAPSULE | Freq: Four times a day (QID) | ORAL | Status: DC | PRN

## 2019-07-24 MED ORDER — TETANUS-DIPHTH-ACELL PERTUSSIS 5-2.5-18.5 LF-MCG/0.5 IM SUSP: 0.5000 mL | Freq: Once | INTRAMUSCULAR | Status: DC

## 2019-07-24 MED ORDER — OXYTOCIN 40 UNITS IN NORMAL SALINE INFUSION - SIMPLE MED: 1.0000 m[IU]/min | INTRAVENOUS | Status: DC

## 2019-07-24 MED ORDER — OXYCODONE-ACETAMINOPHEN 5-325 MG PO TABS
2.0000 | ORAL_TABLET | ORAL | Status: DC | PRN
  Administered 2019-07-25: 18:00:00 2 via ORAL
  Filled 2019-07-24: qty 2

## 2019-07-24 MED ORDER — LACTATED RINGERS IV SOLN
INTRAVENOUS | Status: DC
  Administered 2019-07-24: 17:00:00 via INTRAVENOUS

## 2019-07-24 MED ORDER — FENTANYL CITRATE (PF) 100 MCG/2ML IJ SOLN
100.0000 ug | INTRAMUSCULAR | Status: DC | PRN
  Administered 2019-07-24: 100 ug via INTRAVENOUS
  Filled 2019-07-24: qty 2

## 2019-07-24 MED ORDER — ACETAMINOPHEN 325 MG PO TABS
650.0000 mg | ORAL_TABLET | ORAL | Status: DC | PRN
  Administered 2019-07-24: 21:00:00 650 mg via ORAL
  Filled 2019-07-24: qty 2

## 2019-07-24 MED ORDER — LIDOCAINE HCL (PF) 1 % IJ SOLN: 30.0000 mL | INTRAMUSCULAR | Status: DC | PRN

## 2019-07-24 MED ORDER — PRENATAL MULTIVITAMIN CH
1.0000 | ORAL_TABLET | Freq: Every day | ORAL | Status: DC
  Filled 2019-07-24: qty 1

## 2019-07-24 MED ORDER — SIMETHICONE 80 MG PO CHEW: 80.0000 mg | CHEWABLE_TABLET | ORAL | Status: DC | PRN

## 2019-07-24 MED ORDER — SODIUM CHLORIDE 0.9 % IV SOLN
3.0000 g | Freq: Four times a day (QID) | INTRAVENOUS | Status: DC
  Administered 2019-07-24 – 2019-07-25 (×2): 3 g via INTRAVENOUS
  Filled 2019-07-24 (×3): qty 8

## 2019-07-24 MED ORDER — SENNOSIDES-DOCUSATE SODIUM 8.6-50 MG PO TABS: 2.0000 | ORAL_TABLET | ORAL | Status: DC

## 2019-07-24 MED ORDER — LACTATED RINGERS IV SOLN: 500.0000 mL | INTRAVENOUS | Status: DC | PRN

## 2019-07-24 MED ORDER — OXYTOCIN BOLUS FROM INFUSION
500.0000 mL | Freq: Once | INTRAVENOUS | Status: AC
  Administered 2019-07-24: 19:00:00 500 mL via INTRAVENOUS

## 2019-07-24 MED ORDER — ZOLPIDEM TARTRATE 5 MG PO TABS
5.0000 mg | ORAL_TABLET | Freq: Every evening | ORAL | Status: DC | PRN
  Administered 2019-07-25: 18:00:00 5 mg via ORAL
  Filled 2019-07-24: qty 1

## 2019-07-24 MED ORDER — OXYCODONE-ACETAMINOPHEN 5-325 MG PO TABS: 2.0000 | ORAL_TABLET | ORAL | Status: DC | PRN

## 2019-07-24 MED ORDER — BENZOCAINE-MENTHOL 20-0.5 % EX AERO: 1.0000 "application " | INHALATION_SPRAY | CUTANEOUS | Status: DC | PRN

## 2019-07-24 MED ORDER — ONDANSETRON HCL 4 MG/2ML IJ SOLN: 4.0000 mg | INTRAMUSCULAR | Status: DC | PRN

## 2019-07-24 MED ORDER — ONDANSETRON HCL 4 MG/2ML IJ SOLN
4.0000 mg | Freq: Four times a day (QID) | INTRAMUSCULAR | Status: DC | PRN
  Filled 2019-07-24: qty 2

## 2019-07-24 NOTE — H&P (Signed)
Tammie Byrd is a 18 y.o. female presenting for contractions and vaginal bleeding. OB History    Gravida  2   Para  1   Term  1   Preterm      AB      Living  1     SAB      TAB      Ectopic      Multiple      Live Births  1          Past Medical History:  Diagnosis Date  . Pregnancy induced hypertension    Past Surgical History:  Procedure Laterality Date  . TONSILLECTOMY     Family History: family history includes Cancer in her brother and maternal grandmother; Diabetes in her maternal grandfather; Early death in her brother; Hypertension in her father and maternal grandfather. Social History:  reports that she has never smoked. She has never used smokeless tobacco. She reports that she does not drink alcohol or use drugs.     Maternal Diabetes: No prenatal care, no GDM testing  Genetic Screening: Declined Maternal Ultrasounds/Referrals: Normal Fetal Ultrasounds or other Referrals:  None Maternal Substance Abuse:  No Significant Maternal Medications:  None Significant Maternal Lab Results:  None Other Comments:  No prenatal care.  Review of Systems  All other systems reviewed and are negative.  Maternal Medical History:  Reason for admission: Contractions and vaginal bleeding.   Contractions: Onset was 6-12 hours ago.   Frequency: irregular.   Perceived severity is moderate.    Fetal activity: Last perceived fetal movement was within the past hour.   Patient reports that she has felt the baby move within the last hour.   Prenatal complications: No prenatal care     Dilation: 2 Exam by:: Marcille Buffy, CNM Blood pressure 119/76, pulse 100, temperature 98.2 F (36.8 C), temperature source Oral, resp. rate 18, height 5\' 5"  (1.651 m), weight 95.5 kg, last menstrual period 12/07/2018, unknown if currently breastfeeding. Maternal Exam:  Uterine Assessment: Contraction strength is moderate.  Contraction frequency is irregular.   Abdomen: Patient  reports no abdominal tenderness. Fetal presentation: vertex  Introitus: Normal vulva. Normal vagina.  Pelvis: adequate for delivery.   Cervix: Cervix evaluated by digital exam.     Fetal Exam Fetal Monitor Review: Mode: ultrasound.   Baseline rate: no fetal heart tone seen on [place].      Physical Exam  Nursing note and vitals reviewed. Constitutional: She is oriented to person, place, and time. She appears well-developed and well-nourished. No distress.  HENT:  Head: Normocephalic.  Cardiovascular: Normal rate.  Respiratory: Effort normal.  GI: Soft. There is no abdominal tenderness. There is no rebound.  Genitourinary:    Vulva normal.   Neurological: She is alert and oriented to person, place, and time.  Skin: Skin is warm and dry.  Psychiatric: She has a normal mood and affect.    Prenatal labs: ABO, Rh:  O positive  Antibody:   Rubella:  Immune RPR:    HBsAg:    HIV:    GBS:   unknown   Assessment/Plan: 18 y.o. G2P1001 at [redacted]w[redacted]d  IUFD Admit to labor and delivery TORCH titers ordered  Labor team notified   Marcille Buffy DNP, CNM  07/24/19  3:46 PM

## 2019-07-24 NOTE — Discharge Summary (Signed)
Postpartum Discharge Summary      Patient Name: Tammie Byrd DOB: 25-Mar-2001 MRN: [id number]  Date of admission: 07/24/2019 Delivering Provider: Marcille Buffy D   Date of discharge: 07/25/2019  Admitting diagnosis: CTX since last night Intrauterine pregnancy: [redacted]w[redacted]d    Secondary diagnosis:  Active Problems:   Late prenatal care affecting pregnancy in second trimester   STD (sexually transmitted disease) complicating pregnancy, antepartum   IUFD at 274weeks or more of gestation   Chorioamnionitis  Additional problems: triple I     Discharge diagnosis: pre-term pregnancy, nonviable fetus delivered                                                                                                 Post partum procedures: none  Augmentation: Cytotec  [facility]  Induction of Labor With Vaginal Delivery   18y.o. yo G2P1001 at 349w6das admitted to the hospital 07/24/2019 for induction of labor.  Indication for induction: IUFD.  Patient had an uncomplicated labor course as follows. She was feeling some contractions upon arrival and dilated to 2 cm. Received Cytotec, had SROM and then delivered in the bathroom shortly thereafter.  Membrane Rupture Time/Date: 6:00 PM ,07/24/2019   Intrapartum Procedures: Episiotomy: None [1]                                         Lacerations:  None [1]  Patient had delivery of a Non Viable infant.  Information for the patient's newborn:  InElyna, Pangilinan0[[id number]]Delivery Method: Vaginal, Spontaneous(Filed from Delivery Summary)     Details of delivery can be found in separate delivery note. Shortly after delivery, patient with fever and felt chilled; presumed intra-amniotic infection and started on Unasyn which patient received for 24 hours post-partum. TORCH labs drawn as well as STD testing results are still pending. Patient is discharged home in stable conditions 07/25/19. Delivery time:    Magnesium  Sulfate received: No BMZ received: No Rhophylac:No MMR:No Transfusion:No  Physical exam  Vitals:   07/25/19 0328 07/25/19 0900 07/25/19 1154 07/25/19 1552  BP: (!) 141/82 126/76 119/73 134/73  Pulse: 91 80 82 78  Resp: _0 Temp: 98.5 F (36.9 C)  98 F (36.7 C) 97.7 F (36.5 C)  TempSrc: Oral  Oral Oral  SpO2: 98% 96% 99% 99%  Weight:      Height:       General: alert, cooperative and no distress Lochia: appropriate Uterine Fundus: firm and non tender DVT Evaluation: No evidence of DVT seen on physical exam. Labs: Lab Results  Component Value Date   WBC 13.1 (H) 07/24/2019   HGB 10.5 (L) 07/24/2019   HCT 34.0 (L) 07/24/2019   MCV 76.9 (L) 07/24/2019   PLT 328 07/24/2019   CMP Latest Ref Rng & Units 06/06/2019  Glucose 70 - 99 mg/dL 98  BUN 6 - 20 mg/dL <5(L)  Creatinine 0.44 - 1.00 mg/dL 0.61  Sodium  135 - 145 mmol/L 133(L)  Potassium 3.5 - 5.1 mmol/L 3.4(L)  Chloride 98 - 111 mmol/L 104  CO2 22 - 32 mmol/L 20(L)  Calcium 8.9 - 10.3 mg/dL 8.6(L)  Total Protein 6.5 - 8.1 g/dL 6.8  Total Bilirubin 0.3 - 1.2 mg/dL 0.3  Alkaline Phos 38 - 126 U/L 71  AST 15 - 41 U/L 15  ALT 0 - 44 U/L 12    Discharge instruction: per After Visit Summary and "Baby and Me Booklet".  After visit meds:  Allergies as of 07/25/2019   No Known Allergies     Medication List    STOP taking these medications   potassium chloride 10 MEQ tablet Commonly known as: KLOR-CON     TAKE these medications   ibuprofen 600 MG tablet Commonly known as: ADVIL Take 1 tablet (600 mg total) by mouth every 6 (six) hours. Start taking on: July 26, 2019   Prenate Pixie 10-0.6-0.4-200 MG Caps Take 1 tablet by mouth daily.       Diet: routine diet  Activity: Advance as tolerated. Pelvic rest for 6 weeks.   Outpatient follow up:2 weeks Follow up Appt: Future Appointments  Date Time Provider [place]  08/07/2019 10:40 AM Timken Angwin MFC-US  08/07/2019 10:45  AM [place] 2 WH-MFCUS MFC-US  08/10/2019 10:00 AM Shelly Bombard, MD [place] None   Follow up Visit: [place] for [facility] Follow up.   Specialty: Obstetrics and Gynecology Why: An appointment will be made for you to follow up in 2-4 weeks Contact information: [address] [place], [place] [id number] [place] [address] [phone]           Please schedule this patient for Postpartum visit in: 2 weeks  with the following provider: Any provider For C/S patients schedule nurse incision check in weeks 2 weeks: no High risk pregnancy complicated by: no prenatal care, IUFD  Delivery mode:  SVD Anticipated Birth Control:  other/unsure PP Procedures needed: none   Schedule Integrated BH visit: yes      Newborn Data: Fetal demise: female  Birth Weight: 2109g  Newborn Delivery   Birth date/time: 11/27 @ 1820 Delivery type: SVD      Baby Feeding: NA Disposition:morgue   07/25/2019 Mora Bellman, MD

## 2019-07-24 NOTE — MAU Note (Signed)
Late entry:  Difficulty tracing FHT when patient taken to room 121.  Marcille Buffy, CNM called to room with bedside US to verify.  CNM unable to obtain HT and had formal bedside US ordered.  No FHT noted. Patient reports last feeling movement 1hr prior to coming to hospital.

## 2019-07-24 NOTE — Progress Notes (Signed)
Labor Progress Note Malicia Blasdel is a 18 y.o. G2P1001 at [redacted]w[redacted]d presented for IOL for IUFD. S: Patient overall coping well and understanding of process.   O:  BP 137/89   Pulse 98   Temp 98.2 F (36.8 C) (Oral)   Resp 18   Ht 5\' 5"  (1.651 m)   Wt 95.5 kg   LMP 12/07/2018 (Approximate)   BMI 35.05 kg/m   CVE: Dilation: 2 Exam by:: Marcille Buffy, CNM   A&P: 18 y.o. G2P1001 [redacted]w[redacted]d here for IOL for IUFD. #Labor: Will give 600 mcg Cytotec. Consider Pit thereafter. Patient declined foley bulb at this time. Also declined genetic testing on fetus. #Pain: per patient request #TORCH labs pending, Cephalic by [place] in MAU, polyhydramnios with AFI 45.  Chauncey Mann, MD 5:42 PM

## 2019-07-24 NOTE — MAU Note (Signed)
Patient presents with CTX since last night. States having a few CTX  Per hour.  Vaginal discharge today was yellow with some blood.  Denies LOF.  +FM.

## 2019-07-25 ENCOUNTER — Encounter (HOSPITAL_COMMUNITY): Payer: Self-pay | Admitting: *Deleted

## 2019-07-25 DIAGNOSIS — O411290 Chorioamnionitis, unspecified trimester, not applicable or unspecified: Secondary | ICD-10-CM

## 2019-07-25 LAB — HSV-2 IGG SUPPLEMENTAL TEST: HSV-2 IgG Supplemental Test: POSITIVE — AB

## 2019-07-25 LAB — TOXOPLASMA GONDII ANTIBODY, IGG: Toxoplasma IgG Ratio: <3 IU/mL (ref 0.0–7.1)

## 2019-07-25 LAB — HSV 2 ANTIBODY, IGG: HSV 2 Glycoprotein G Ab, IgG: 2.03 index — ABNORMAL HIGH (ref 0.00–0.90)

## 2019-07-25 LAB — HSV 1 ANTIBODY, IGG: HSV 1 Glycoprotein G Ab, IgG: 20.8 index — ABNORMAL HIGH (ref 0.00–0.90)

## 2019-07-25 LAB — CMV ANTIBODY, IGG (EIA): CMV Ab - IgG: 9.7 U/mL — ABNORMAL HIGH (ref 0.00–0.59)

## 2019-07-25 LAB — RPR
RPR Ser Ql: REACTIVE — AB
RPR Titer: >=1:256 {titer}

## 2019-07-25 LAB — RUBELLA SCREEN: Rubella: 2.39 index (ref >0.99)

## 2019-07-25 MED ORDER — SODIUM CHLORIDE 0.9 % IV SOLN: 3.0000 g | Freq: Four times a day (QID) | INTRAVENOUS | Status: DC

## 2019-07-25 MED ORDER — SODIUM CHLORIDE 0.9 % IV SOLN
INTRAVENOUS | Status: DC
  Administered 2019-07-25: 05:00:00 via INTRAVENOUS

## 2019-07-25 MED ORDER — SODIUM CHLORIDE 0.9 % IV SOLN
3.0000 g | Freq: Four times a day (QID) | INTRAVENOUS | Status: AC
  Administered 2019-07-25 (×2): 3 g via INTRAVENOUS
  Filled 2019-07-25 (×2): qty 8

## 2019-07-25 MED ORDER — IBUPROFEN 600 MG PO TABS: 600.0000 mg | ORAL_TABLET | Freq: Four times a day (QID) | ORAL | 0 refills | Status: DC

## 2019-07-25 NOTE — Progress Notes (Signed)
Post Partum Day 1 Subjective: Patient is without complaints. She denies HA, visual changes, SOB, ;ightheadedness/dizziness  Objective: Blood pressure (!) 141/82, pulse 91, temperature 98.5 F (36.9 C), temperature source Oral, resp. rate 17, height 5\' 5"  (1.651 m), weight 95.5 kg, last menstrual period 12/07/2018, SpO2 98 %, unknown if currently breastfeeding.  Physical Exam:  General: alert, cooperative and no distress Lochia: appropriate Uterine Fundus: firm, non tender DVT Evaluation: No evidence of DVT seen on physical exam.  Recent Labs    07/24/19 1658  HGB 10.5*  HCT 34.0*    Assessment/Plan: Patient stable s/p preterm delivery with IUFD Continue antibiotics for 24 hours due to presumed triple I Discharge later today or tomorrow   LOS: 1 day   Lauri Till 07/25/2019, 9:12 AM

## 2019-07-25 NOTE — Plan of Care (Signed)
  Problem: Education: Goal: Knowledge of General Education information will improve Description: Including pain rating scale, medication(s)/side effects and non-pharmacologic comfort measures Outcome: Completed/Met

## 2019-07-25 NOTE — Progress Notes (Signed)
Pt was sitting up in bed, using her phone when I arrived. She immediately said she was fine. She said she didn't have any questions but just wanted to get it done. When asked to describe what that means, she said "I'm going to get her cremated."  She denied having any questions or needing additional information. Pt said she did not need anything and said she appreciated me stopping by.  Please page if additional assistance is needed. [place], MDiv   07/25/19 1700  Clinical Encounter Type  Visited With Patient

## 2019-07-25 NOTE — Progress Notes (Signed)
Pt discharged home with printed instructions, a list of funeral homes, and a patient placement card. Pt states she understood. Toya Smothers, RN

## 2019-07-25 NOTE — Lactation Note (Signed)
Lactation Consultation Note  Patient Name: Tammie Byrd BMZTA'E Date: 07/25/2019  [facility] entered room patient was asleep at this time.   Maternal Data    Feeding    LATCH Score                   Interventions    Lactation Tools Discussed/Used     Consult Status      Vicente Serene 07/25/2019, 1:47 AM

## 2019-07-25 NOTE — Discharge Instructions (Signed)

## 2019-07-25 NOTE — Progress Notes (Signed)
CSW received consult for patient due to IUFD. CSW met with patient at bedside to complete discussion. Patient answered all of CSW questions with one word responses and did not make eye contact with CSW. CSW attempted to offer mental health and grief resources, and the patient denied. Patient did state yes to having a supportive family and FOB at home. Patient did state yes to her having a two year old daughter at home. Patient's affect was very flat throughout CSW interaction.  CSW spoke with Terri, RN and the patient has presented the same for her throughout the day.  Madilyn Fireman, MSW, LCSW-A Transitions of Care  Clinical Social Worker  [facility] Emergency Departments  Medical ICU [phone]

## 2019-07-25 NOTE — Lactation Note (Signed)
Lactation Consultation Note  Patient Name: Tammie Byrd [id number] Date: 07/25/2019   Lactation Consultant attempted another visit, but Mom was sleeping on her side.  Will speak to her RN about asking Mom if she would like to speak to [facility] prior to probable discharge this evening.   Broadus John 07/25/2019, 1:50 PM

## 2019-07-27 ENCOUNTER — Other Ambulatory Visit: Payer: Self-pay | Admitting: Obstetrics

## 2019-07-27 ENCOUNTER — Encounter (HOSPITAL_COMMUNITY): Payer: Self-pay | Admitting: *Deleted

## 2019-07-27 DIAGNOSIS — O364XX0 Maternal care for intrauterine death, not applicable or unspecified: Secondary | ICD-10-CM

## 2019-07-27 DIAGNOSIS — O0932 Supervision of pregnancy with insufficient antenatal care, second trimester: Secondary | ICD-10-CM

## 2019-07-27 LAB — T.PALLIDUM AB, TOTAL: T Pallidum Abs: REACTIVE — AB

## 2019-07-28 LAB — TORCH-IGM(TOXO/ RUB/ CMV/ HSV) W TITER
CMV IgM: <30 AU/mL (ref 0.0–29.9)
HSVI/II Comb IgM: <0.91 Ratio (ref 0.00–0.90)
Rubella IgM: <20 AU/mL (ref 0.0–19.9)
Toxoplasma Antibody- IgM: <3 AU/mL (ref 0.0–7.9)

## 2019-07-28 LAB — GC/CHLAMYDIA PROBE AMP (~~LOC~~) NOT AT ARMC
Chlamydia: POSITIVE — AB
Comment: NEGATIVE
Comment: NORMAL
Neisseria Gonorrhea: NEGATIVE

## 2019-07-28 LAB — INFECT DISEASE AB IGM REFLEX 1

## 2019-07-28 LAB — SURGICAL PATHOLOGY

## 2019-07-29 ENCOUNTER — Encounter: Payer: Self-pay | Admitting: *Deleted

## 2019-07-29 ENCOUNTER — Telehealth: Payer: Self-pay | Admitting: *Deleted

## 2019-07-29 MED ORDER — AZITHROMYCIN 500 MG PO TABS: ORAL_TABLET | ORAL | 1 refills | Status: DC

## 2019-07-29 NOTE — Telephone Encounter (Signed)
-----   Message from Chancy Milroy, MD sent at 07/29/2019  1:13 PM EST ----- Please let pt know that her Chlamydia is still postive Azithromycin 500 mg  Disp # 4 Take all at once. Reframe from IC until TOC TOC in 3-4 weeks Partner needs to been seen and treated as well.  Thanks Arlina Robes, MD

## 2019-07-29 NOTE — Telephone Encounter (Signed)
Telephone call to patient regardin + chlamydia.  Patient was not in and voicemail would not accept messages.  Letter mailed to patient's home.  Tx's routed to patient's pharmacy

## 2019-07-30 ENCOUNTER — Telehealth (HOSPITAL_COMMUNITY): Payer: Self-pay

## 2019-07-31 ENCOUNTER — Telehealth (HOSPITAL_COMMUNITY): Payer: Self-pay

## 2019-08-04 NOTE — BH Specialist Note (Addendum)
Pt did not arrive to video visit and did not answer the phone; Phone voicemail is full, and unable to leave voice message, and pt does not have MyChart.   [place] via Telemedicine Video Visit  08/04/2019 Tammie Byrd [id number]  [name]

## 2019-08-07 ENCOUNTER — Ambulatory Visit (HOSPITAL_COMMUNITY): Payer: Medicaid Other

## 2019-08-07 ENCOUNTER — Ambulatory Visit: Payer: Medicaid Other | Admitting: Clinical

## 2019-08-07 ENCOUNTER — Encounter (HOSPITAL_COMMUNITY): Payer: Self-pay

## 2019-08-07 ENCOUNTER — Other Ambulatory Visit: Payer: Self-pay

## 2019-08-07 DIAGNOSIS — Z5329 Procedure and treatment not carried out because of patient's decision for other reasons: Secondary | ICD-10-CM

## 2019-08-07 DIAGNOSIS — Z91199 Patient's noncompliance with other medical treatment and regimen due to unspecified reason: Secondary | ICD-10-CM

## 2019-08-10 ENCOUNTER — Ambulatory Visit: Payer: Medicaid Other | Admitting: Obstetrics

## 2019-09-15 ENCOUNTER — Telehealth: Payer: Self-pay | Admitting: Obstetrics

## 2020-03-31 ENCOUNTER — Other Ambulatory Visit: Payer: Self-pay

## 2020-03-31 ENCOUNTER — Encounter (HOSPITAL_COMMUNITY): Payer: Self-pay | Admitting: Emergency Medicine

## 2020-03-31 ENCOUNTER — Ambulatory Visit (HOSPITAL_COMMUNITY)
Admission: EM | Admit: 2020-03-31 | Discharge: 2020-03-31 | Disposition: A | Discharge status: Home / Self Care | Payer: Medicaid Other

## 2020-03-31 DIAGNOSIS — L03012 Cellulitis of left finger: Secondary | ICD-10-CM

## 2020-03-31 DIAGNOSIS — M79645 Pain in left finger(s): Secondary | ICD-10-CM

## 2020-03-31 NOTE — ED Provider Notes (Signed)
[facility]   [id number] 03/31/20 Arrival Time: 1915  VZ:DGLOV PAIN  SUBJECTIVE: History from: patient. Atiyana Byrd is a 19 y.o. female complains of left thumb pain for the last 2 days. Reports that the area is swollen, red, warm and tender to touch.  Denies a precipitating event or specific injury. Localizes the pain to the medial aspect of the left thumb nail.  Describes the pain as constant and throbbing in character.  Has tried OTC medications without relief.  Symptoms are made worse with activity.  Denies similar symptoms in the past.  Denies fever, chills, erythema, ecchymosis, effusion, weakness, numbness and tingling, saddle paresthesias, loss of bowel or bladder function.      ROS: As per HPI.  All other pertinent ROS negative.     Past Medical History:  Diagnosis Date  . Pregnancy induced hypertension    Past Surgical History:  Procedure Laterality Date  . TONSILLECTOMY     No Known Allergies No current facility-administered medications on file prior to encounter.   Current Outpatient Medications on File Prior to Encounter  Medication Sig Dispense Refill  . azithromycin (ZITHROMAX) 500 MG tablet Take 4 tablets at once for treatment with food 4 tablet 1  . ibuprofen (ADVIL) 600 MG tablet Take 1 tablet (600 mg total) by mouth every 6 (six) hours. 30 tablet 0  . Prenat-FeAsp-Meth-FA-DHA w/o A (PRENATE PIXIE) 10-0.6-0.4-200 MG CAPS Take 1 tablet by mouth daily. (Patient not taking: Reported on 07/10/2019) 30 capsule 12   Social History   Socioeconomic History  . Marital status: Single    Spouse name: Not on file  . Number of children: Not on file  . Years of education: Not on file  . Highest education level: Not on file  Occupational History  . Not on file  Tobacco Use  . Smoking status: Never Smoker  . Smokeless tobacco: Never Used  Vaping Use  . Vaping Use: Never used  Substance and Sexual Activity  . Alcohol use: No  . Drug use: No  . Sexual  activity: Yes    Birth control/protection: None    Comment: currently pregnant  Other Topics Concern  . Not on file  Social History Narrative  . Not on file   Social Determinants of Health   Financial Resource Strain:   . Difficulty of Paying Living Expenses:   Food Insecurity:   . Worried About Programme researcher, broadcasting/film/video in the Last Year:   . Barista in the Last Year:   Transportation Needs:   . Freight forwarder (Medical):   Marland Kitchen Lack of Transportation (Non-Medical):   Physical Activity:   . Days of Exercise per Week:   . Minutes of Exercise per Session:   Stress:   . Feeling of Stress :   Social Connections:   . Frequency of Communication with Friends and Family:   . Frequency of Social Gatherings with Friends and Family:   . Attends Religious Services:   . Active Member of Clubs or Organizations:   . Attends Banker Meetings:   Marland Kitchen Marital Status:   Intimate Partner Violence:   . Fear of Current or Ex-Partner:   . Emotionally Abused:   Marland Kitchen Physically Abused:   . Sexually Abused:    Family History  Problem Relation Age of Onset  . Hypertension Father   . Cancer Brother   . Early death Brother   . Cancer Maternal Grandmother   . Diabetes Maternal Grandfather   .  Hypertension Maternal Grandfather     OBJECTIVE:  There were no vitals filed for this visit.  General appearance: ALERT; in no acute distress.  Head: NCAT Lungs: Normal respiratory effort CV:  pulses 2+ bilaterally. Cap refill < 2 seconds Musculoskeletal:  Inspection: Skin warm, dry, clear and intact without obvious erythema, effusion, or ecchymosis.  Palpation: Nontender to palpation ROM: FROM active and passive Skin: warm and dry, paronychia to left thumb Neurologic: Ambulates without difficulty; Sensation intact about the upper/ lower extremities Psychological: alert and cooperative; normal mood and affect  DIAGNOSTIC STUDIES:  No results found.   ASSESSMENT & PLAN:  1.  Paronychia of thumb, left   2. Thumb pain, left     May use neosporin and a bandaid as needed Follow up with increased pain, redness, swelling, tenderness, drainage to the area Follow up with PCP if symptoms persist Return or go to the ER if you have any new or worsening symptoms (fever, chills, chest pain, abdominal pain, changes in bowel or bladder habits, pain radiating into lower legs)   Reviewed expectations re: course of current medical issues. Questions answered. Outlined signs and symptoms indicating need for more acute intervention. Patient verbalized understanding. After Visit Summary given.       Moshe Cipro, NP 03/31/20 1958

## 2020-03-31 NOTE — ED Triage Notes (Signed)
Pt c/o left thumb infection x 2 days. Thumb is swollen and red and sore to the touch around the nailbed.

## 2020-03-31 NOTE — Discharge Instructions (Signed)
You had an infection at the cuticle of your left thumb  We drained the area well today  May apply neosporin and bandaid as needed  Follow up in this office for  increased redness, tenderness, swelling, heat, drainage from the area

## 2020-05-04 ENCOUNTER — Other Ambulatory Visit: Payer: Self-pay

## 2020-05-04 ENCOUNTER — Emergency Department (HOSPITAL_COMMUNITY)
Admission: EM | Admit: 2020-05-04 | Discharge: 2020-05-05 | Disposition: A | Discharge status: Home / Self Care | Payer: Medicaid Other | Attending: Emergency Medicine | Admitting: Emergency Medicine

## 2020-05-04 ENCOUNTER — Encounter (HOSPITAL_COMMUNITY): Payer: Self-pay

## 2020-05-04 ENCOUNTER — Emergency Department (HOSPITAL_COMMUNITY): Payer: Medicaid Other

## 2020-05-04 DIAGNOSIS — U071 COVID-19: Secondary | ICD-10-CM | POA: Insufficient documentation

## 2020-05-04 DIAGNOSIS — E860 Dehydration: Secondary | ICD-10-CM | POA: Insufficient documentation

## 2020-05-04 DIAGNOSIS — Z79899 Other long term (current) drug therapy: Secondary | ICD-10-CM | POA: Diagnosis not present

## 2020-05-04 DIAGNOSIS — R05 Cough: Secondary | ICD-10-CM | POA: Diagnosis present

## 2020-05-04 LAB — COMPREHENSIVE METABOLIC PANEL
ALT: 17 U/L (ref 0–44)
AST: 27 U/L (ref 15–41)
Albumin: 3.2 g/dL — ABNORMAL LOW (ref 3.5–5.0)
Alkaline Phosphatase: 44 U/L (ref 38–126)
Anion gap: 11 (ref 5–15)
BUN: 6 mg/dL (ref 6–20)
CO2: 23 mmol/L (ref 22–32)
Calcium: 8.1 mg/dL — ABNORMAL LOW (ref 8.9–10.3)
Chloride: 103 mmol/L (ref 98–111)
Creatinine, Ser: 1.06 mg/dL — ABNORMAL HIGH (ref 0.44–1.00)
GFR calc Af Amer: >60 mL/min (ref >60)
GFR calc non Af Amer: >60 mL/min (ref >60)
Glucose, Bld: 115 mg/dL — ABNORMAL HIGH (ref 70–99)
Potassium: 3.4 mmol/L — ABNORMAL LOW (ref 3.5–5.1)
Sodium: 137 mmol/L (ref 135–145)
Total Bilirubin: 0.5 mg/dL (ref 0.3–1.2)
Total Protein: 7.7 g/dL (ref 6.5–8.1)

## 2020-05-04 LAB — CBC
HCT: 37 % (ref 36.0–46.0)
Hemoglobin: 11.6 g/dL — ABNORMAL LOW (ref 12.0–15.0)
MCH: 25.8 pg — ABNORMAL LOW (ref 26.0–34.0)
MCHC: 31.4 g/dL (ref 30.0–36.0)
MCV: 82.4 fL (ref 80.0–100.0)
Platelets: 239 10*3/uL (ref 150–400)
RBC: 4.49 MIL/uL (ref 3.87–5.11)
RDW: 14.7 % (ref 11.5–15.5)
WBC: 6.2 10*3/uL (ref 4.0–10.5)
nRBC: 0 % (ref 0.0–0.2)

## 2020-05-04 LAB — URINALYSIS, ROUTINE W REFLEX MICROSCOPIC
Bilirubin Urine: NEGATIVE
Glucose, UA: 50 mg/dL — AB
Ketones, ur: 5 mg/dL — AB
Nitrite: NEGATIVE
Protein, ur: 100 mg/dL — AB
Specific Gravity, Urine: 1.023 (ref 1.005–1.030)
pH: 5 (ref 5.0–8.0)

## 2020-05-04 LAB — SARS CORONAVIRUS 2 BY RT PCR (HOSPITAL ORDER, PERFORMED IN ~~LOC~~ HOSPITAL LAB): SARS Coronavirus 2: POSITIVE — AB

## 2020-05-04 LAB — I-STAT BETA HCG BLOOD, ED (MC, WL, AP ONLY): I-stat hCG, quantitative: <5 m[IU]/mL (ref <5)

## 2020-05-04 LAB — LIPASE, BLOOD: Lipase: 35 U/L (ref 11–51)

## 2020-05-04 MED ORDER — ONDANSETRON 4 MG PO TBDP: 4.0000 mg | ORAL_TABLET | Freq: Three times a day (TID) | ORAL | 0 refills | Status: DC | PRN

## 2020-05-04 MED ORDER — ACETAMINOPHEN 325 MG PO TABS
650.0000 mg | ORAL_TABLET | Freq: Once | ORAL | Status: AC
  Administered 2020-05-04: 650 mg via ORAL
  Filled 2020-05-04: qty 2

## 2020-05-04 MED ORDER — ONDANSETRON HCL 4 MG/2ML IJ SOLN
4.0000 mg | Freq: Once | INTRAMUSCULAR | Status: AC
  Administered 2020-05-04: 4 mg via INTRAVENOUS
  Filled 2020-05-04: qty 2

## 2020-05-04 MED ORDER — SODIUM CHLORIDE 0.9 % IV BOLUS
1000.0000 mL | Freq: Once | INTRAVENOUS | Status: AC
  Administered 2020-05-04: 1000 mL via INTRAVENOUS

## 2020-05-04 NOTE — Discharge Instructions (Addendum)
At this time there does not appear to be the presence of an emergent medical condition, however there is always the potential for conditions to change. Please read and follow the below instructions.  Please return to the Emergency Department immediately for any new or worsening symptoms. Please be sure to follow up with your Primary Care Provider within one week regarding your visit today; please call their office to schedule an appointment even if you are feeling better for a follow-up visit. You may use the medication Zofran as prescribed to help with nausea and vomiting.  Zofran will dissolve under your tongue.  Please drink plenty of water to avoid dehydration.  Please get plenty of rest.  Please continue to isolate and wear your mask or others to avoid spread of COVID-19. You have been referred to the monoclonal antibody infusion center.  They should reach out to you in the next 1-2 days to see if you qualify for an infusion. You may use over-the-counter anti-inflammatory such as Tylenol as directed on the packaging to help with fever.  Get help right away if: You have trouble breathing. You have pain or pressure in your chest. You have confusion. You have bluish lips and fingernails. You have difficulty waking from sleep. You have any new/concerning or worsening of symptoms These symptoms may represent a serious problem that is an emergency. Do not wait to see if the symptoms will go away. Get medical help right away. Call your local emergency services (911 in the [place] Do not drive yourself to the hospital. Let the emergency medical personnel know if you think you have COVID-19.  Please read the additional information packets attached to your discharge summary.  Do not take your medicine if  develop an itchy rash, swelling in your mouth or lips, or difficulty breathing; call 911 and seek immediate emergency medical attention if this occurs.  You may review your lab tests and imaging  results in their entirety on your MyChart account.  Please discuss all results of fully with your primary care provider and other specialist at your follow-up visit.  Note: Portions of this text may have been transcribed using voice recognition software. Every effort was made to ensure accuracy; however, inadvertent computerized transcription errors may still be present.

## 2020-05-04 NOTE — ED Triage Notes (Signed)
Patient complains of abdominal pain with SOB x 4-5 days. Patient states nausea with same. Patient alert and oriented, complains of feeling hot for same. Alert and oriented, NAD

## 2020-05-04 NOTE — ED Notes (Signed)
Ck pt pulse oximetry while ambulated  Pt oxygen stayed at 96 Pulse 89

## 2020-05-04 NOTE — ED Provider Notes (Signed)
[facility] EMERGENCY DEPARTMENT Provider Note   CSN: [id number] Arrival date & time: 05/04/20  1349     History No chief complaint on file.   Tammie Byrd is a 19 y.o. female otherwise healthy no daily medication use presents today requesting Covid test.  Patient reports that around 5 days ago she developed symptoms of COVID-19 including loss of taste/smell, cough, nausea and diarrhea.  She reports feeling warm at home but has not measured a fever.  Patient reports feeling short of breath only when coughing, denies any shortness of breath when she is not coughing, no shortness of breath with exertion.  She describes her cough as intermittent nonproductive.  Denies any associated chest pain.  Patient reports one episode of nonbloody/nonbilious emesis yesterday, none today.  She denies any abdominal pain.  Of note the triage documentation reports abdominal pain but patient adamantly denies any abdominal pain at all.  She reports some nonbloody diarrhea over the past 1 day.  Denies measured a fever.  Denies headache, sore throat, chest pain, hemoptysis, abdominal pain, dysuria/hematuria, extremity swelling/color change, history of blood clot, recent surgery/immobilization, history of cancer or any additional concerns.  Patient unvaccinated against COVID-19. HPI     Past Medical History:  Diagnosis Date  . Pregnancy induced hypertension     Patient Active Problem List   Diagnosis Date Noted  . 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 IUFD at 20 weeks or more of gestation 07/24/2019  . Pica 06/06/2019  . Anemia affecting pregnancy 06/06/2019  . STD (sexually transmitted disease) complicating pregnancy, antepartum 05/02/2017  . 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 Severe preeclampsia 05/02/2017  . Encephalocele of fetus affecting management of mother in singleton pregnancy, antepartum 05/02/2017  . Abnormality of fetus 01/01/2017  . GBS (group B Streptococcus carrier), +RV  culture, currently pregnant 12/26/2016  . Late prenatal care affecting pregnancy in second trimester 12/21/2016  . Supervision of high risk pregnancy, antepartum 12/19/2016    Past Surgical History:  Procedure Laterality Date  . TONSILLECTOMY       OB History    Gravida  2   Para  2   Term  1   Preterm  1   AB      Living  1     SAB      TAB      Ectopic      Multiple  0   Live Births  1           Family History  Problem Relation Age of Onset  . Hypertension Father   . Cancer Brother   . Early death Brother   . Cancer Maternal Grandmother   . Diabetes Maternal Grandfather   . Hypertension Maternal Grandfather     Social History   Tobacco Use  . Smoking status: Never Smoker  . Smokeless tobacco: Never Used  Vaping Use  . Vaping Use: Never used  Substance Use Topics  . Alcohol use: No  . Drug use: No    Home Medications Prior to Admission medications   Medication Sig Start Date End Date Taking? Authorizing Provider  azithromycin (ZITHROMAX) 500 MG tablet Take 4 tablets at once for treatment with food 07/29/19   Hermina Staggers, MD  ibuprofen (ADVIL) 600 MG tablet Take 1 tablet (600 mg total) by mouth every 6 (six) hours. 07/26/19   Constant, Peggy, MD  ondansetron (ZOFRAN ODT) 4 MG disintegrating tablet Take 1 tablet (4 mg total) by mouth every 8 (eight) hours  as needed for nausea or vomiting. 05/04/20   Harlene Salts A, PA-C  Prenat-FeAsp-Meth-FA-DHA w/o A (PRENATE PIXIE) 10-0.6-0.4-200 MG CAPS Take 1 tablet by mouth daily. Patient not taking: Reported on 07/10/2019 12/21/16   Roe Coombs, CNM    Allergies    Patient has no known allergies.  Review of Systems   Review of Systems Ten systems are reviewed and are negative for acute change except as noted in the HPI  Physical Exam Updated Vital Signs BP 137/86   Pulse (!) 107   Temp (!) 102.8 F (39.3 C) (Oral)   Resp 17   SpO2 92%   Physical Exam Constitutional:      General:  She is not in acute distress.    Appearance: Normal appearance. She is well-developed. She is not ill-appearing or diaphoretic.  HENT:     Head: Normocephalic and atraumatic.  Eyes:     General: Vision grossly intact. Gaze aligned appropriately.     Pupils: Pupils are equal, round, and reactive to light.  Neck:     Trachea: Trachea and phonation normal.  Pulmonary:     Effort: Pulmonary effort is normal. No respiratory distress.  Abdominal:     General: There is no distension.     Palpations: Abdomen is soft.     Tenderness: There is no abdominal tenderness. There is no guarding or rebound.  Musculoskeletal:        General: Normal range of motion.     Cervical back: Normal range of motion.     Right lower leg: No edema.     Left lower leg: No edema.  Skin:    General: Skin is warm and dry.  Neurological:     Mental Status: She is alert.     GCS: GCS eye subscore is 4. GCS verbal subscore is 5. GCS motor subscore is 6.     Comments: Speech is clear and goal oriented, follows commands Major Cranial nerves without deficit, no facial droop Moves extremities without ataxia, coordination intact  Psychiatric:        Behavior: Behavior normal.    ED Results / Procedures / Treatments   Labs (all labs ordered are listed, but only abnormal results are displayed) Labs Reviewed  SARS CORONAVIRUS 2 BY RT PCR (HOSPITAL ORDER, PERFORMED IN [facility]) - Abnormal; Notable for the following components:      Result Value   SARS Coronavirus 2 POSITIVE (*)    All other components within normal limits  COMPREHENSIVE METABOLIC PANEL - Abnormal; Notable for the following components:   Potassium 3.4 (*)    Glucose, Bld 115 (*)    Creatinine, Ser 1.06 (*)    Calcium 8.1 (*)    Albumin 3.2 (*)    All other components within normal limits  CBC - Abnormal; Notable for the following components:   Hemoglobin 11.6 (*)    MCH 25.8 (*)    All other components within normal limits    URINALYSIS, ROUTINE W REFLEX MICROSCOPIC - Abnormal; Notable for the following components:   Color, Urine AMBER (*)    APPearance CLOUDY (*)    Glucose, UA 50 (*)    Hgb urine dipstick SMALL (*)    Ketones, ur 5 (*)    Protein, ur 100 (*)    Leukocytes,Ua TRACE (*)    Bacteria, UA RARE (*)    All other components within normal limits  LIPASE, BLOOD  I-STAT BETA HCG BLOOD, ED ([facility], [facility], [facility]  ONLY)    EKG EKG Interpretation  Date/Time:  Wednesday May 04 2020 20:04:40 EDT Ventricular Rate:  103 PR Interval:    QRS Duration: 106 QT Interval:  327 QTC Calculation: 428 R Axis:   -11 Text Interpretation: Sinus tachycardia Probable left ventricular hypertrophy Nonspecific T abnormalities, anterior leads Confirmed by Marianna Fuss ([id number]) on 05/04/2020 11:11:54 PM   Radiology DG Chest Portable 1 View  Result Date: 05/04/2020 CLINICAL DATA:  COVID, shortness of breath EXAM: PORTABLE CHEST 1 VIEW COMPARISON:  None. FINDINGS: Airspace consolidation noted in the left upper lobe. Patchy opacities in the lung bases. Heart is normal size. Low lung volumes. No effusions or acute bony abnormality. IMPRESSION: Patchy left upper lobe and bibasilar opacities concerning for pneumonia. Electronically Signed   By: Charlett Nose M.D.   On: 05/04/2020 19:24    Procedures Procedures (including critical care time)  Medications Ordered in ED Medications  sodium chloride 0.9 % bolus 1,000 mL (0 mLs Intravenous Stopped 05/04/20 2128)  ondansetron (ZOFRAN) injection 4 mg (4 mg Intravenous Given 05/04/20 2124)  acetaminophen (TYLENOL) tablet 650 mg (650 mg Oral Given 05/04/20 2246)    ED Course  I have reviewed the triage vital signs and the nursing notes.  Pertinent labs & imaging results that were available during my care of the patient were reviewed by me and considered in my medical decision making (see chart for details).    MDM Rules/Calculators/A&P                          Additional history  obtained from: 1. Nursing notes from this visit. 2. Electronic medical record. --------------------------- I reviewed and interpreted labs which include: Pregnancy test negative. CMP shows mild elevation of creatinine 1.06, suspect secondary dehydration, no emergent electrolyte derangement, LFT elevation or gap. Covid test positive. Lipase within normal limits no evidence of pancreatitis. CBC shows mild anemia of 11.6 improved from prior, no leukocytosis to suggest bacterial infection. Urinalysis shows evidence of dehydration and contamination, patient without urinary symptoms doubt UTI.  CXR:  IMPRESSION:  Patchy left upper lobe and bibasilar opacities concerning for  pneumonia.  ---------------------------------------- Patient reevaluated she was resting comfortably no acute distress using her phone.  She has tolerated p.o. without difficulty, no recurrence of nausea or vomiting.  Ambulating around the room without hypoxia, tachycardia or shortness of breath.  Cranial nerves intact, airway clear, no meningeal signs, neuro exam within normal limits.  Cardiopulmonary exam within normal limits.  Abdomen soft nontender without peritoneal signs.  Neurovascular tact to all 4 extremities without evidence of DVT.  As stated above patient denies any history of abdominal pain during her illness, abdomen is soft and nontender no evidence for cholecystitis, appendicitis, perforation, SBO or other emergent intra-abdominal pathologies.  Suspect patient's nausea vomiting cough and other symptoms are attributable to COVID-19 viral infection.  I referred patient to monoclonal antibody infusion center, she is aware to keep her phone with her for a call for scheduling to see if she qualifies.  She has been prescribed Zofran to help with nausea, encouraged to maintain water hydration and follow-up with her PCP.  Finally patient reports she only feels short of breath during coughing fits, she has no shortness of  breath at any other time and no chest pain.  Doubt pulmonary embolism, ACS or other emergent cardiopulmonary etiologies.  She is aware of evidence of viral pneumonia on her chest x-ray, no indication for antibiotics at this time.  We also discussed patient obtaining a pulse oximeter to measure her SPO2 at home and return to the ER if it drops below 90%.  At this time there does not appear to be any evidence of an acute emergency medical condition and the patient appears stable for discharge with appropriate outpatient follow up. Diagnosis was discussed with patient who verbalizes understanding of care plan and is agreeable to discharge. I have discussed return precautions with patient who verbalizes understanding. Patient encouraged to follow-up with their PCP. All questions answered.  Patient's case discussed with Dr. Stevie Kernykstra who agrees with plan to discharge with follow-up.   Cathlyn ParsonsSymone Hey was evaluated in Emergency Department on 05/04/2020 for the symptoms described in the history of present illness. She was evaluated in the context of the global COVID-19 pandemic, which necessitated consideration that the patient might be at risk for infection with the SARS-CoV-2 virus that causes COVID-19. Institutional protocols and algorithms that pertain to the evaluation of patients at risk for COVID-19 are in a state of rapid change based on information released by regulatory bodies including the CDC and federal and state organizations. These policies and algorithms were followed during the patient's care in the ED.  Note: Portions of this report may have been transcribed using voice recognition software. Every effort was made to ensure accuracy; however, inadvertent computerized transcription errors may still be present. Final Clinical Impression(s) / ED Diagnoses Final diagnoses:  COVID-19 virus infection  Dehydration    Rx / DC Orders ED Discharge Orders         Ordered    ondansetron (ZOFRAN ODT) 4 MG  disintegrating tablet  Every 8 hours PRN        05/04/20 2320           Elizabeth PalauMorelli, Eusebio Blazejewski A, PA-C 05/04/20 2325    Milagros Lollykstra, Richard S, MD 05/05/20 1616

## 2020-05-05 ENCOUNTER — Encounter: Payer: Self-pay | Admitting: Oncology

## 2020-05-05 ENCOUNTER — Telehealth: Payer: Self-pay | Admitting: Oncology

## 2020-05-05 NOTE — Telephone Encounter (Signed)
Called to Discuss with patient about Covid symptoms and the use of regeneron, a monoclonal antibody infusion for those with mild to moderate Covid symptoms and at a high risk of hospitalization.     Pt is qualified for this infusion at the [facility] infusion center due to co-morbid conditions and/or a member of an at-risk group.    Past Medical History:  Diagnosis Date  . Pregnancy induced hypertension    Unable to reach pt. Left message to return call- Also Left Mychart message  Mignon Pine, AGNP-C [phone] [facility])

## 2021-03-30 ENCOUNTER — Other Ambulatory Visit: Payer: Self-pay

## 2021-03-30 ENCOUNTER — Inpatient Hospital Stay (HOSPITAL_COMMUNITY)
Admission: AD | Admit: 2021-03-30 | Discharge: 2021-03-30 | Disposition: A | Discharge status: Home / Self Care | Payer: Medicaid Other | Attending: Obstetrics and Gynecology | Admitting: Obstetrics and Gynecology

## 2021-03-30 DIAGNOSIS — Z3200 Encounter for pregnancy test, result unknown: Secondary | ICD-10-CM | POA: Insufficient documentation

## 2021-03-30 NOTE — MAU Note (Addendum)
Presents stating she's here to determine if pregnant secondary no menstrual cycle since May.  Denies pain.  Reports "I basically want a pregnancy test."

## 2021-03-30 NOTE — MAU Provider Note (Signed)
Event Date/Time   First Provider Initiated Contact with Patient 03/30/21 1244      S [name] is a 20 y.o. G2P1101 patient who presents to MAU today requesting a pregnancy test. She has not taken a home pregnancy test. She reported "stomach getting hard and hurting" to MAU registration but denies all complaints and concerns to triage RN and later to MAU CNM. She denies abdominal pain, back pain, vaginal bleeding, dysuria, abdominal tenderness, fever or recent illness.  O BP 115/64 (BP Location: Right Arm)   Pulse 93   Temp 98.2 F (36.8 C) (Oral)   Resp 20   Ht 5\' 8"  (1.727 m)   Wt 96.6 kg   LMP 01/18/2021   SpO2 100%   BMI 32.37 kg/m    Physical Exam Vitals and nursing note reviewed. Exam conducted with a chaperone present.  Constitutional:      Appearance: Normal appearance.  Cardiovascular:     Rate and Rhythm: Normal rate.  Pulmonary:     Effort: Pulmonary effort is normal.  Skin:    Capillary Refill: Capillary refill takes less than 2 seconds.  Neurological:     Mental Status: She is alert and oriented to person, place, and time.  Psychiatric:        Mood and Affect: Mood normal.        Behavior: Behavior normal.        Thought Content: Thought content normal.        Judgment: Judgment normal.    A Medical screening exam complete Patient without complaints No recent home pregnancy test Patient appropriate for walk-in pregnancy test at [facility] office  P Discharge from MAU in stable condition Given contact card with highlight address of [facility] for pregnancy confirmation Patient may return to MAU as needed for pregnancy-related emergencies  [facility], CNM 03/30/2021 2:38 PM

## 2021-08-03 ENCOUNTER — Encounter (HOSPITAL_COMMUNITY): Payer: Self-pay

## 2021-08-03 ENCOUNTER — Other Ambulatory Visit: Payer: Self-pay

## 2021-08-03 ENCOUNTER — Ambulatory Visit (HOSPITAL_COMMUNITY)
Admission: EM | Admit: 2021-08-03 | Discharge: 2021-08-03 | Disposition: A | Discharge status: Home / Self Care | Payer: Medicaid Other | Attending: Physician Assistant | Admitting: Physician Assistant

## 2021-08-03 DIAGNOSIS — M25551 Pain in right hip: Secondary | ICD-10-CM | POA: Diagnosis present

## 2021-08-03 DIAGNOSIS — Z113 Encounter for screening for infections with a predominantly sexual mode of transmission: Secondary | ICD-10-CM | POA: Diagnosis present

## 2021-08-03 LAB — POC URINE PREG, ED: Preg Test, Ur: NEGATIVE

## 2021-08-03 MED ORDER — NAPROXEN 500 MG PO TABS: 500.0000 mg | ORAL_TABLET | Freq: Two times a day (BID) | ORAL | 0 refills | Status: DC

## 2021-08-03 NOTE — ED Provider Notes (Signed)
[facility]    CSN: [id number] Arrival date & time: 08/03/21  1513      History   Chief Complaint Chief Complaint  Patient presents with   SEXUALLY TRANSMITTED DISEASE    HPI Tammie Byrd is a 20 y.o. female.   Patient presents today requesting STI testing.  She denies any significant pelvic pain, abdominal pain, nausea, vomiting, vaginal discharge.  She denies any known exposure or significant symptoms but is interested in complete panel.  She has had chlamydia in the past but reports completing treatment and having negative test of cure.  Denies any recent antibiotics.  She does not believe she is pregnant but is open to testing.  In addition, patient reports a several month history of right hip pain.  Reports that when she abducts this joint she develops a clicking sensation with a momentary pain.  She denies ongoing pain and is able to ambulate without difficulty.  She denies any injury or change in activity prior to symptom onset.  She has not tried any over-the-counter medications for symptom management.  She denies any numbness or paresthesias.  She has not seen a specialist.  She is able perform daily activities despite symptoms.   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   Severe preeclampsia 05/02/2017   Encephalocele of fetus affecting management of mother in singleton pregnancy, antepartum 05/02/2017   Abnormality of fetus 01/01/2017   GBS (group B Streptococcus carrier), +RV culture, currently pregnant 12/26/2016   Late prenatal care affecting pregnancy in second trimester 12/21/2016   Supervision of high risk pregnancy,  antepartum 12/19/2016    Past Surgical History:  Procedure Laterality Date   TONSILLECTOMY      OB History     Gravida  2   Para  2   Term  1   Preterm  1   AB      Living  1      SAB      IAB      Ectopic      Multiple  0   Live Births  1            Home Medications    Prior to Admission medications   Medication Sig Start Date End Date Taking? Authorizing Provider  naproxen (NAPROSYN) 500 MG tablet Take 1 tablet (500 mg total) by mouth 2 (two) times daily. 08/03/21  Yes Anna-Marie Coller, Noberto Retort, PA-C    Family History Family History  Problem Relation Age of Onset   Hypertension Father    Cancer Brother    Early death Brother    Cancer Maternal Grandmother    Diabetes Maternal Grandfather    Hypertension Maternal Grandfather     Social History Social History   Tobacco Use   Smoking status: Never   Smokeless tobacco: Never  Vaping Use   Vaping Use: Never used  Substance Use Topics   Alcohol use: No   Drug use: No     Allergies   Patient has no known allergies.   Review of Systems Review of Systems  Constitutional:  Positive for activity change. Negative for appetite change, fatigue and fever.  Respiratory:  Negative for cough and shortness of breath.   Cardiovascular:  Negative for chest pain.  Gastrointestinal:  Negative for abdominal pain, diarrhea, nausea and vomiting.  Genitourinary:  Negative for dysuria, frequency, pelvic pain, vaginal bleeding, vaginal discharge and vaginal pain.  Musculoskeletal:  Positive for arthralgias. Negative for gait problem, joint swelling and myalgias.  Neurological:  Negative for dizziness, light-headedness and headaches.    Physical Exam Triage Vital Signs ED Triage Vitals [08/03/21 1607]  Enc Vitals Group     BP 127/69     Pulse Rate 91     Resp      Temp 98.7 F (37.1 C)     Temp Source Oral     SpO2 100 %     Weight      Height      Head Circumference      Peak Flow      Pain Score 0      Pain Loc      Pain Edu?      Excl. in GC?    No data found.  Updated Vital Signs BP 127/69 (BP Location: Left Arm)   Pulse 91   Temp 98.7 F (37.1 C) (Oral)   LMP  (LMP Unknown)   SpO2 100%   Visual Acuity Right Eye Distance:   Left Eye Distance:   Bilateral Distance:    Right Eye Near:   Left Eye Near:    Bilateral Near:     Physical Exam Vitals reviewed.  Constitutional:      General: She is awake. She is not in acute distress.    Appearance: Normal appearance. She is well-developed. She is not ill-appearing.     Comments: Very pleasant female appears in age no acute distress sitting comfortably in exam room  HENT:     Head: Normocephalic and atraumatic.  Cardiovascular:     Rate and Rhythm: Normal rate and regular rhythm.     Heart sounds: Normal heart sounds, S1 normal and S2 normal. No murmur heard. Pulmonary:     Effort: Pulmonary effort is normal.     Breath sounds: Normal breath sounds. No wheezing, rhonchi or rales.     Comments: Clear to auscultation bilaterally Abdominal:     General: Bowel sounds are normal.     Palpations: Abdomen is soft.     Tenderness: There is no abdominal tenderness. There is no right CVA tenderness, left CVA tenderness, guarding or rebound.  Genitourinary:    Comments: Exam deferred Musculoskeletal:     Right hip: No deformity, tenderness, bony tenderness or crepitus. Normal range of motion.     Comments: Right hip: Strength 5/5.  Normal active range of motion.  Popping/clicking sensation with abduction.  Normal gait.  Psychiatric:        Behavior: Behavior is cooperative.     UC Treatments / Results  Labs (all labs ordered are listed, but only abnormal results are displayed) Labs Reviewed  POC URINE PREG, ED  CERVICOVAGINAL ANCILLARY ONLY    EKG   Radiology No results found.  Procedures Procedures (including critical care time)  Medications Ordered in UC Medications - No data to display  Initial Impression /  Assessment and Plan / UC Course  I have reviewed the triage vital signs and the nursing notes.  Pertinent labs & imaging results that were available during my care of the patient were reviewed by me and considered in my medical decision making (see chart for details).  STI swab collected today-results pending.  We will contact patient to arrange treatment based on laboratory results history is currently asymptomatic.  Ordered HIV/hepatitis C/RPR but patient became nervous with blood draw and ultimately declined this testing which was canceled.  Discussed the importance of safe sex practices.  Discussed alarm symptoms that warrant emergent evaluation.  Strict return precautions given to which she expressed understanding.  Patient was started on anti-inflammatory to help with hip pain.  No indication for plain films given she denies any recent trauma and has no point tenderness.  She was instructed not to take NSAIDs with Naprosyn due to risk of GI bleeding.  Discussed the potential utility of seeing a physical therapist and she was given contact information for local sports medicine clinic for further evaluation and to consider additional interventions.  Discussed alarm symptoms that warrant emergent evaluation.  Strict return precautions given to which she expressed understanding.  Final Clinical Impressions(s) / UC Diagnoses   Final diagnoses:  Right hip pain  Routine screening for STI (sexually transmitted infection)     Discharge Instructions      Your urine pregnancy test was negative.  We will contact you if any change in additional treatment based on your swab result.  Use Naprosyn twice daily for pain.  Do not take NSAIDs including aspirin, ibuprofen/Advil, naproxen/Aleve as it can cause stomach bleeding.  Follow-up with sports medicine as we discussed.     ED Prescriptions     Medication Sig Dispense Auth. Provider   naproxen (NAPROSYN) 500 MG tablet Take 1 tablet (500 mg  total) by mouth 2 (two) times daily. 30 tablet Charleton Deyoung, Noberto Retort, PA-C      PDMP not reviewed this encounter.   Jeani Hawking, PA-C 08/03/21 1655

## 2021-08-03 NOTE — Discharge Instructions (Signed)
Your urine pregnancy test was negative.  We will contact you if any change in additional treatment based on your swab result.  Use Naprosyn twice daily for pain.  Do not take NSAIDs including aspirin, ibuprofen/Advil, naproxen/Aleve as it can cause stomach bleeding.  Follow-up with sports medicine as we discussed.

## 2021-08-03 NOTE — ED Triage Notes (Signed)
Pt is here for STI testing.

## 2021-08-03 NOTE — ED Triage Notes (Signed)
Pt presents to the office for STI testing with blood work.

## 2021-08-06 LAB — CERVICOVAGINAL ANCILLARY ONLY
Bacterial Vaginitis (gardnerella): POSITIVE — AB
Candida Glabrata: NEGATIVE
Candida Vaginitis: POSITIVE — AB
Chlamydia: NEGATIVE
Comment: NEGATIVE
Comment: NEGATIVE
Comment: NEGATIVE
Comment: NEGATIVE
Comment: NEGATIVE
Comment: NORMAL
Neisseria Gonorrhea: NEGATIVE
Trichomonas: NEGATIVE

## 2021-08-07 ENCOUNTER — Telehealth (HOSPITAL_COMMUNITY): Payer: Self-pay | Admitting: Emergency Medicine

## 2021-08-07 MED ORDER — METRONIDAZOLE 500 MG PO TABS: 500.0000 mg | ORAL_TABLET | Freq: Two times a day (BID) | ORAL | 0 refills | Status: DC

## 2021-08-07 MED ORDER — FLUCONAZOLE 150 MG PO TABS: 150.0000 mg | ORAL_TABLET | Freq: Once | ORAL | 0 refills | Status: AC

## 2021-08-24 ENCOUNTER — Ambulatory Visit (HOSPITAL_COMMUNITY)
Admission: EM | Admit: 2021-08-24 | Discharge: 2021-08-24 | Disposition: A | Discharge status: Home / Self Care | Payer: Medicaid Other | Attending: Emergency Medicine | Admitting: Emergency Medicine

## 2021-08-24 ENCOUNTER — Other Ambulatory Visit: Payer: Self-pay

## 2021-08-24 ENCOUNTER — Encounter (HOSPITAL_COMMUNITY): Payer: Self-pay | Admitting: *Deleted

## 2021-08-24 DIAGNOSIS — Z113 Encounter for screening for infections with a predominantly sexual mode of transmission: Secondary | ICD-10-CM | POA: Insufficient documentation

## 2021-08-24 LAB — POCT URINALYSIS DIPSTICK, ED / UC
Bilirubin Urine: NEGATIVE
Glucose, UA: NEGATIVE mg/dL
Leukocytes,Ua: NEGATIVE
Nitrite: NEGATIVE
Protein, ur: NEGATIVE mg/dL
Specific Gravity, Urine: >=1.03 (ref 1.005–1.030)
Urobilinogen, UA: 0.2 mg/dL (ref 0.0–1.0)
pH: 6 (ref 5.0–8.0)

## 2021-08-24 LAB — POC URINE PREG, ED: Preg Test, Ur: NEGATIVE

## 2021-08-24 LAB — HIV ANTIBODY (ROUTINE TESTING W REFLEX): HIV Screen 4th Generation wRfx: NONREACTIVE

## 2021-08-24 NOTE — Discharge Instructions (Addendum)
The results of your STD testing today will be made available to you once received.  They will initially be posted to your MyChart and, if any of your results are abnormal, you will receive a phone call with those results along with further instructions regarding treatment.

## 2021-08-24 NOTE — ED Provider Notes (Signed)
[facility]    CSN: [id number] Arrival date & time: 08/24/21  1743    HISTORY   Chief Complaint  Patient presents with   Vaginal Bleeding   HPI Tammie Byrd is a 20 y.o. female. Patient requests STI testing, was seen here on 12/8 for same but had to leave before labs were drawn, was able to cyto swab which was positive for BV and yeast.  Patient also complains of spotting x 2 weeks, does not need tampon or pad, endorses mild intermittent cramping.  Patient denies vaginal discharge, genital lesions, suprapubic pain, burning with urination, fever, aches, chills.     The history is provided by the patient.  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   Severe preeclampsia 05/02/2017   Encephalocele of fetus affecting management of mother in singleton pregnancy, antepartum 05/02/2017   Abnormality of fetus 01/01/2017   GBS (group B Streptococcus carrier), +RV culture, currently pregnant 12/26/2016   Late prenatal care affecting pregnancy in second trimester 12/21/2016   Supervision of high risk pregnancy, antepartum 12/19/2016   Past Surgical History:  Procedure Laterality Date   TONSILLECTOMY     OB History     Gravida  2   Para  2   Term  1   Preterm  1   AB      Living  1      SAB      IAB      Ectopic      Multiple  0   Live Births  1          Home Medications    Prior to Admission medications   Medication Sig Start Date End Date Taking? Authorizing Provider  naproxen (NAPROSYN) 500 MG tablet Take 1 tablet (500 mg total) by mouth 2 (two) times daily. 08/03/21   Raspet, Noberto Retort, PA-C   Family  History Family History  Problem Relation Age of Onset   Hypertension Father    Cancer Brother    Early death Brother    Cancer Maternal Grandmother    Diabetes Maternal Grandfather    Hypertension Maternal Grandfather    Social History Social History   Tobacco Use   Smoking status: Never   Smokeless tobacco: Never  Vaping Use   Vaping Use: Never used  Substance Use Topics   Alcohol use: No   Drug use: No   Allergies   Patient has no known allergies.  Review of Systems Review of Systems Pertinent findings noted in history of present illness.   Physical Exam Triage Vital Signs ED Triage Vitals  Enc Vitals Group     BP 06/23/21 0827 (!) 147/82     Pulse Rate 06/23/21 0827 72     Resp 06/23/21 0827 18     Temp 06/23/21 0827 98.3 F (36.8 C)     Temp Source 06/23/21 0827 Oral     SpO2 06/23/21 0827 98 %     Weight --      Height --      Head Circumference --      Peak Flow --      Pain Score 06/23/21 0826 5  Pain Loc --      Pain Edu? --      Excl. in GC? --   No data found.  Updated Vital Signs BP 127/61 (BP Location: Left Arm)    Pulse 84    Temp 98 F (36.7 C) (Oral)    Resp 16    LMP 08/10/2021    SpO2 98%   Physical Exam Vitals and nursing note reviewed.  Constitutional:      General: She is not in acute distress.    Appearance: Normal appearance. She is not ill-appearing.  HENT:     Head: Normocephalic and atraumatic.  Eyes:     General: Lids are normal.        Right eye: No discharge.        Left eye: No discharge.     Extraocular Movements: Extraocular movements intact.     Conjunctiva/sclera: Conjunctivae normal.     Right eye: Right conjunctiva is not injected.     Left eye: Left conjunctiva is not injected.  Neck:     Trachea: Trachea and phonation normal.  Cardiovascular:     Rate and Rhythm: Normal rate and regular rhythm.     Pulses: Normal pulses.     Heart sounds: Normal heart sounds. No murmur heard.   No friction rub. No  gallop.  Pulmonary:     Effort: Pulmonary effort is normal. No accessory muscle usage, prolonged expiration or respiratory distress.     Breath sounds: Normal breath sounds. No stridor, decreased air movement or transmitted upper airway sounds. No decreased breath sounds, wheezing, rhonchi or rales.  Chest:     Chest wall: No tenderness.  Musculoskeletal:        General: Normal range of motion.     Cervical back: Normal range of motion and neck supple. Normal range of motion.  Lymphadenopathy:     Cervical: No cervical adenopathy.  Skin:    General: Skin is warm and dry.     Findings: No erythema or rash.  Neurological:     General: No focal deficit present.     Mental Status: She is alert and oriented to person, place, and time.  Psychiatric:        Mood and Affect: Mood normal.        Behavior: Behavior normal.    Visual Acuity Right Eye Distance:   Left Eye Distance:   Bilateral Distance:    Right Eye Near:   Left Eye Near:    Bilateral Near:     UC Couse / Diagnostics / Procedures:    EKG  Radiology No results found.  Procedures Procedures (including critical care time)  UC Diagnoses / Final Clinical Impressions(s)   I have reviewed the triage vital signs and the nursing notes.  Pertinent labs & imaging results that were available during my care of the patient were reviewed by me and considered in my medical decision making (see chart for details).   Final diagnoses:  Screening examination for STD (sexually transmitted disease)   Patient advised that spotting is a normal process when uterine lining is incompletely had during menstruation.  Patient advised that we will notify her of the results of STD screening once available, if any treatment is needed that will be provided for her.  ED Prescriptions   None    PDMP not reviewed this encounter.  Pending results:  Labs Reviewed  POCT URINALYSIS DIPSTICK, ED / UC - Abnormal; Notable for the following  components:  Result Value   Ketones, ur TRACE (*)    Hgb urine dipstick MODERATE (*)    All other components within normal limits  RPR  HIV ANTIBODY (ROUTINE TESTING W REFLEX)  POCT URINALYSIS DIP (MANUAL ENTRY)  POCT URINE PREGNANCY  POC URINE PREG, ED  CERVICOVAGINAL ANCILLARY ONLY    Medications Ordered in UC: Medications - No data to display  Disposition Upon Discharge:  Condition: stable for discharge home  Patient presents today with concerns for exposure to sexually transmitted disease, requesting testing.  STD screening was performed as indicated.  Patient has been advised that the results of screening will be made available to them via MyChart and, if there are any positive findings, they will be contacted by phone, recommendations for treatment will be advised and prescriptions will be provided as indicated based on clinical guidelines.  Patient has also been advised that if treatment is recommended, they should abstain from sexual intercourse of all forms until treatment is complete.  Patient has further been advised that once treatment is complete, they have not had a complete resolution of their symptoms, if any, they should continue to abstain from sexual intercourse with all forms and follow-up with her primary care provider or return to urgent care for repeat testing.  As such, the patient has been evaluated and assessed, work-up was performed and treatment was provided in alignment with urgent care protocols and evidence based medicine.  Patient/parent/caregiver has been advised that the patient may require follow up for further testing and/or treatment if the symptoms continue in spite of treatment, as clinically indicated and appropriate.  Routine symptom specific, illness specific and/or disease specific instructions were discussed with the patient and/or caregiver at length.  Prevention strategies for avoiding STD exposure were also discussed.  The patient will  follow up with their current PCP if and as advised. If the patient does not currently have a PCP we will assist them in obtaining one.   The patient may need specialty follow up if the symptoms continue, in spite of conservative treatment and management, for further workup, evaluation, consultation and treatment as clinically indicated and appropriate.  Patient/parent/caregiver verbalized understanding and agreement of plan as discussed.  All questions were addressed during visit.  Please see discharge instructions below for further details of plan.  Discharge Instructions:   Discharge Instructions      The results of your STD testing today will be made available to you once received.  They will initially be posted to your MyChart and, if any of your results are abnormal, you will receive a phone call with those results along with further instructions regarding treatment.       This office note has been dictated using Teaching laboratory technician.  Unfortunately, and despite my best efforts, this method of dictation can sometimes lead to occasional typographical or grammatical errors.  I apologize in advance if this occurs.      Theadora Rama Scales, PA-C 08/24/21 2029

## 2021-08-24 NOTE — ED Triage Notes (Addendum)
Patient reports vaginal bleeding x 2 weeks, states that she is spotting more than bleeding. Does not need tampon or pad. Reports mild intermittent cramping.   Also would like STI testing, was seen here on 12/8 for same but had to leave before labs were drawn, was able to cyto swab. Would like to be tested for STD again due to having unprotected sex since her testing.

## 2021-08-25 LAB — CERVICOVAGINAL ANCILLARY ONLY

## 2021-08-25 LAB — RPR
RPR Ser Ql: REACTIVE — AB
RPR Titer: 1:8 {titer}

## 2021-08-26 IMAGING — US US MFM OB DETAIL+14 WK
1 series · 13 of 28 positions shown · non-contrast
Comparison: none

[Series 1: us mfm ob detail+14 wk · 13 of 89 slices shown]
[im 4/89]
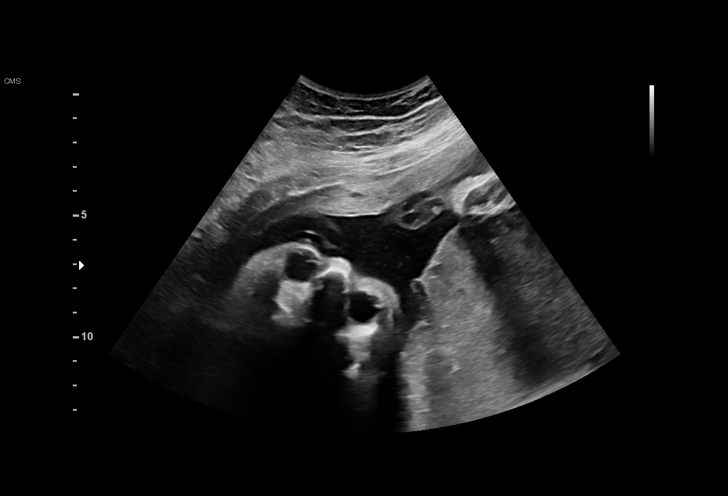
[im 10/89]
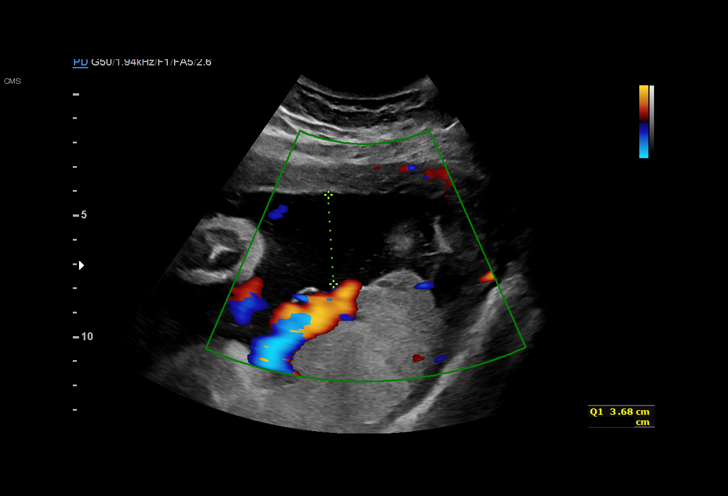
[im 17/89]
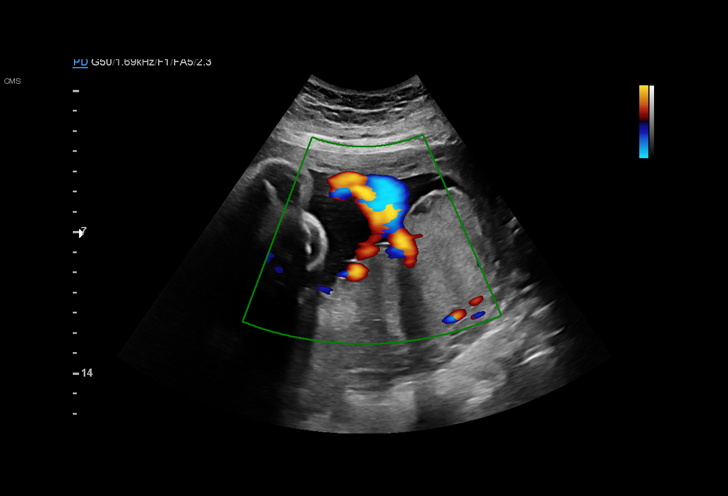
[im 23/89]
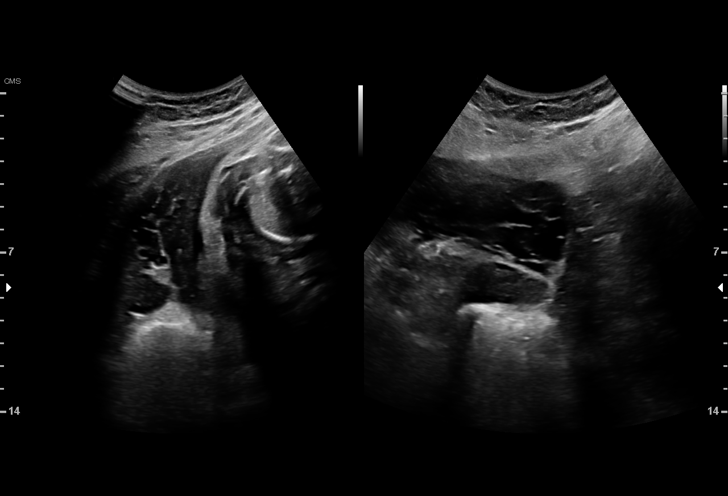
[im 30/89]
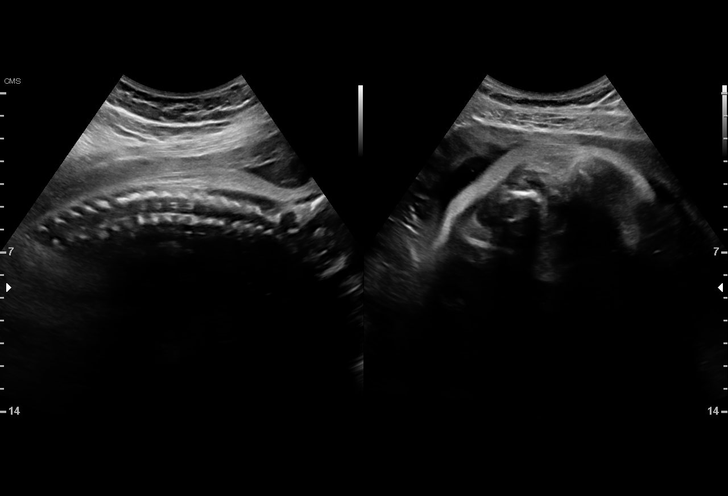
[im 36/89]
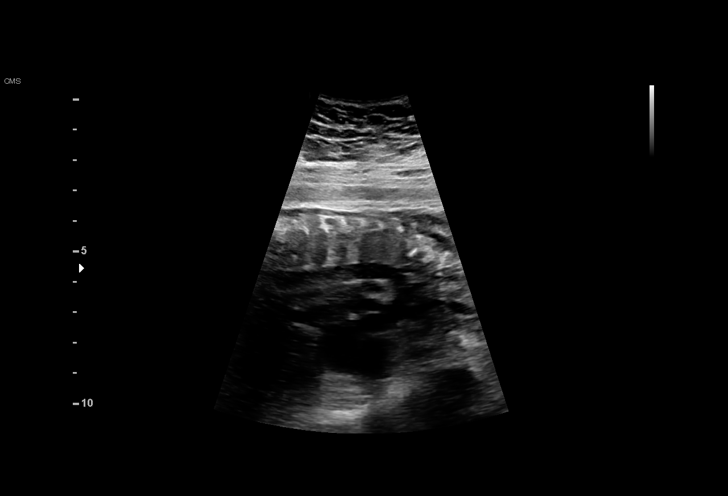
[im 46/89]
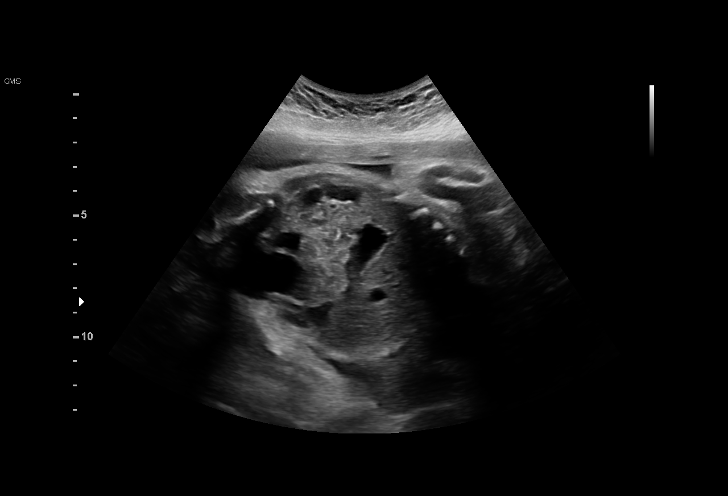
[im 53/89]
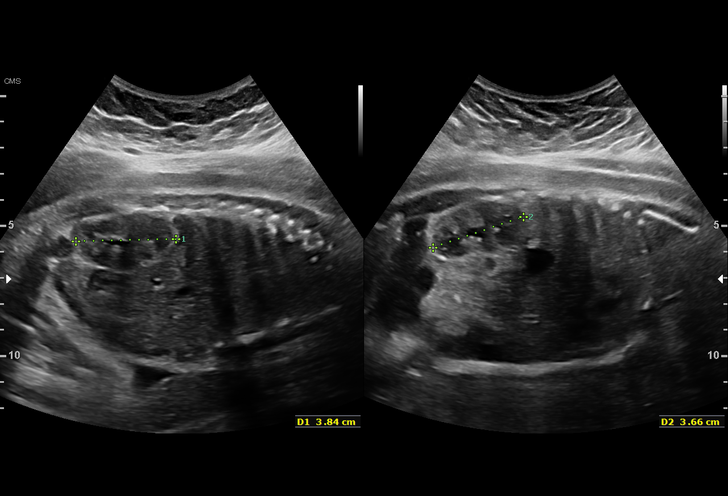
[im 59/89]
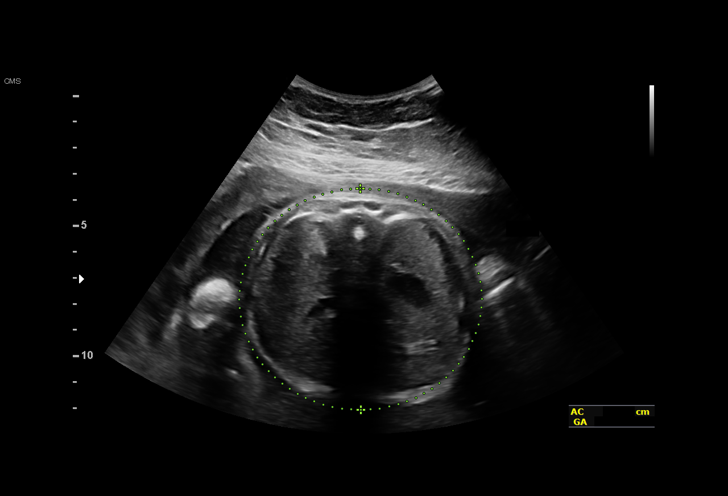
[im 66/89]
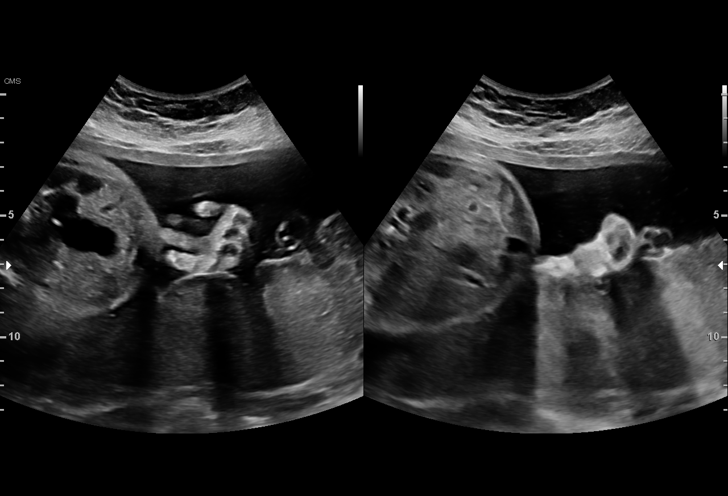
[im 72/89]
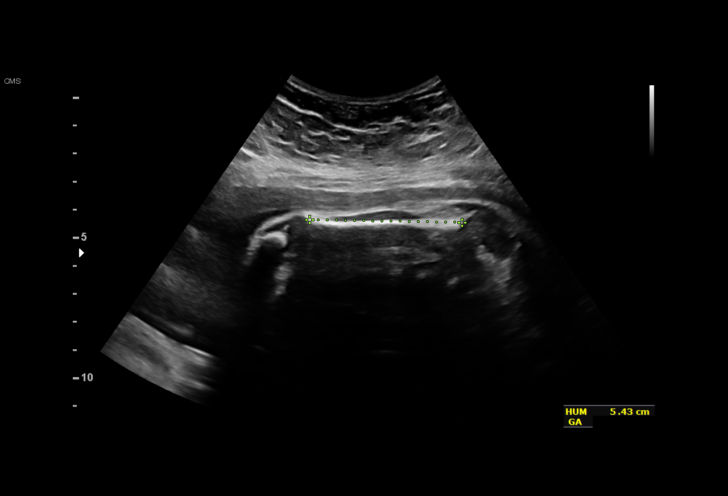
[im 79/89]
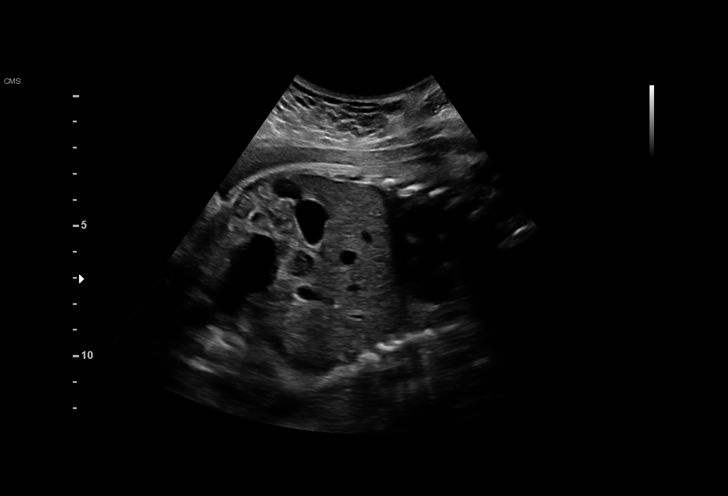
[im 85/89]
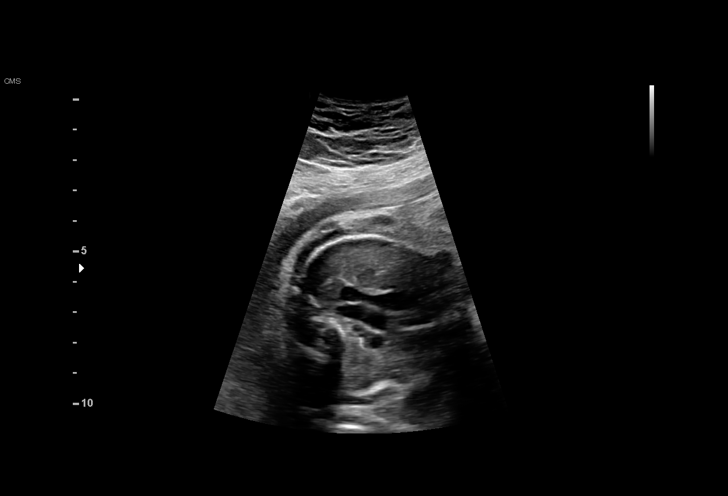

[13 of 28 positions shown; findings below may reference images not displayed]

----------------------------------------------------------------------

 ----------------------------------------------------------------------
Indications

  Previous child with congenital anomaly,
  antepartum (encephalocele)
  Late to prenatal care, third trimester
  Antenatal screening for malformations
  Encounter for uncertain dates
  31 weeks gestation of pregnancy
 ----------------------------------------------------------------------
Fetal Evaluation

 Num Of Fetuses:         1
 Cardiac Activity:       Observed
 Presentation:           Cephalic
 Placenta:               Posterior
 P. Cord Insertion:      Visualized

 Amniotic Fluid
 AFI FV:      Within normal limits

 AFI Sum(cm)     %Tile       Largest Pocket(cm)
 17.05           62

 RUQ(cm)       RLQ(cm)       LUQ(cm)        LLQ(cm)

Biometry

 BPD:      79.4  mm     G. Age:  31w 6d         41  %    CI:        74.63   %    70 - 86
                                                         FL/HC:      20.9   %    19.1 -
 HC:      291.7  mm     G. Age:  32w 1d         21  %    HC/AC:      1.05        0.96 -
 AC:      278.7  mm     G. Age:  31w 6d         50  %    FL/BPD:     76.8   %    71 - 87
 FL:         61  mm     G. Age:  31w 5d         32  %    FL/AC:      21.9   %    20 - 24
 HUM:      54.8  mm     G. Age:  31w 6d         51  %

 Est. FW:    1363  gm      4 lb 1 oz     39  %
OB History

 Gravidity:    2         Term:   1        Prem:   0        SAB:   0
 TOP:          0       Ectopic:  0        Living: 1
Gestational Age

 LMP:           30w 5d        Date:  12/07/18                 EDD:   09/13/19
 U/S Today:     31w 6d                                        EDD:   09/05/19
 Best:          31w 6d     Det. By:  U/S (07/10/19)           EDD:   09/05/19
Anatomy

 Cranium:               Appears normal         Aortic Arch:            Appears normal
 Cavum:                 Appears normal         Ductal Arch:            Not well visualized
 Ventricles:            Appears normal         Diaphragm:              Appears normal
 Choroid Plexus:        Appears normal         Stomach:                Appears normal, left
                                                                       sided
 Cerebellum:            Appears normal         Abdomen:                Appears normal
 Posterior Fossa:       Appears normal         Abdominal Wall:         Appears nml (cord
                                                                       insert, abd wall)
 Nuchal Fold:           Not applicable (>20    Cord Vessels:           Appears normal (3
                        wks GA)                                        vessel cord)
 Face:                  Appears normal         Kidneys:                Appear normal
                        (orbits and profile)
 Lips:                  Appears normal         Bladder:                Appears normal
 Thoracic:              Appears normal         Spine:                  Appears normal
 Heart:                 Appears normal         Upper Extremities:      Visualized
                        (4CH, axis, and
                        situs)
 RVOT:                  Not well visualized    Lower Extremities:      Visualized
 LVOT:                  Appears normal

 Other:  Feet and hands/[employer] visualized. Nasal bone visualized. Technically
         difficult due to advanced gestational age.
Cervix Uterus Adnexa

 Cervix
 Not visualized (advanced GA >95wks)

 Uterus
 No abnormality visualized.

 Left Ovary
 Within normal limits.

 Right Ovary
 Within normal limits.

 Cul De Sac
 No free fluid seen.

 Adnexa
 No abnormality visualized.
Comments

 This patient was seen for a detailed fetal anatomy scan as
 she has not had any prenatal care in her current pregnancy.
 She has a prior child who was born with an encephalocele.
 The patient left the office before I had a chance to see her.
 Based on the fetal biometry measurements obtained today,
 her EDC should be September 05, 2019.
 The views of the fetal anatomy were limited today due to her
 advanced gestational age.  There was normal amniotic fluid
 noted.
 Anomalies may be missed due to technical limitations. If the
 fetus is in a suboptimal position or maternal habitus is
 increased, visualization of the fetus in the maternal uterus
 may be impaired.
 We will contact the patient to return for another growth
 ultrasound to confirm her dates in 4 weeks.

## 2021-08-28 LAB — T.PALLIDUM AB, TOTAL: T Pallidum Abs: REACTIVE — AB

## 2021-08-29 ENCOUNTER — Other Ambulatory Visit: Payer: Self-pay

## 2021-08-29 ENCOUNTER — Ambulatory Visit (HOSPITAL_COMMUNITY)
Admission: EM | Admit: 2021-08-29 | Discharge: 2021-08-29 | Disposition: A | Discharge status: Home / Self Care | Payer: Medicaid Other | Attending: Internal Medicine | Admitting: Internal Medicine

## 2021-08-29 DIAGNOSIS — Z01411 Encounter for gynecological examination (general) (routine) with abnormal findings: Secondary | ICD-10-CM | POA: Insufficient documentation

## 2021-08-29 DIAGNOSIS — Z113 Encounter for screening for infections with a predominantly sexual mode of transmission: Secondary | ICD-10-CM | POA: Diagnosis present

## 2021-08-29 DIAGNOSIS — N760 Acute vaginitis: Secondary | ICD-10-CM | POA: Diagnosis not present

## 2021-08-30 LAB — CERVICOVAGINAL ANCILLARY ONLY
Bacterial Vaginitis (gardnerella): POSITIVE — AB
Candida Glabrata: NEGATIVE
Candida Vaginitis: NEGATIVE
Chlamydia: NEGATIVE
Comment: NEGATIVE
Comment: NEGATIVE
Comment: NEGATIVE
Comment: NEGATIVE
Comment: NEGATIVE
Comment: NORMAL
Neisseria Gonorrhea: NEGATIVE
Trichomonas: NEGATIVE

## 2021-09-01 ENCOUNTER — Telehealth (HOSPITAL_COMMUNITY): Payer: Self-pay | Admitting: Emergency Medicine

## 2021-09-01 MED ORDER — METRONIDAZOLE 500 MG PO TABS: 500.0000 mg | ORAL_TABLET | Freq: Two times a day (BID) | ORAL | 0 refills | Status: DC

## 2021-10-09 ENCOUNTER — Emergency Department (HOSPITAL_COMMUNITY)
Admission: EM | Admit: 2021-10-09 | Discharge: 2021-10-09 | Disposition: A | Discharge status: Home / Self Care | Payer: Medicaid Other | Attending: Emergency Medicine | Admitting: Emergency Medicine

## 2021-10-09 ENCOUNTER — Ambulatory Visit: Payer: Self-pay

## 2021-10-09 ENCOUNTER — Encounter (HOSPITAL_COMMUNITY): Payer: Self-pay

## 2021-10-09 ENCOUNTER — Other Ambulatory Visit: Payer: Self-pay

## 2021-10-09 DIAGNOSIS — D649 Anemia, unspecified: Secondary | ICD-10-CM | POA: Diagnosis not present

## 2021-10-09 DIAGNOSIS — N939 Abnormal uterine and vaginal bleeding, unspecified: Secondary | ICD-10-CM | POA: Insufficient documentation

## 2021-10-09 LAB — CBC
HCT: 32.9 % — ABNORMAL LOW (ref 36.0–46.0)
Hemoglobin: 10.4 g/dL — ABNORMAL LOW (ref 12.0–15.0)
MCH: 27.7 pg (ref 26.0–34.0)
MCHC: 31.6 g/dL (ref 30.0–36.0)
MCV: 87.5 fL (ref 80.0–100.0)
Platelets: 522 10*3/uL — ABNORMAL HIGH (ref 150–400)
RBC: 3.76 MIL/uL — ABNORMAL LOW (ref 3.87–5.11)
RDW: 14.5 % (ref 11.5–15.5)
WBC: 6.8 10*3/uL (ref 4.0–10.5)
nRBC: 0 % (ref 0.0–0.2)

## 2021-10-09 LAB — BASIC METABOLIC PANEL
Anion gap: 8 (ref 5–15)
BUN: 10 mg/dL (ref 6–20)
CO2: 25 mmol/L (ref 22–32)
Calcium: 8.6 mg/dL — ABNORMAL LOW (ref 8.9–10.3)
Chloride: 107 mmol/L (ref 98–111)
Creatinine, Ser: 0.87 mg/dL (ref 0.44–1.00)
GFR, Estimated: >60 mL/min (ref >60)
Glucose, Bld: 101 mg/dL — ABNORMAL HIGH (ref 70–99)
Potassium: 3.7 mmol/L (ref 3.5–5.1)
Sodium: 140 mmol/L (ref 135–145)

## 2021-10-09 LAB — I-STAT BETA HCG BLOOD, ED (MC, WL, AP ONLY): I-stat hCG, quantitative: <5 m[IU]/mL (ref <5)

## 2021-10-09 MED ORDER — MEGESTROL ACETATE 40 MG PO TABS: 40.0000 mg | ORAL_TABLET | Freq: Two times a day (BID) | ORAL | 0 refills | Status: DC

## 2021-10-09 NOTE — ED Triage Notes (Signed)
Patient reports abnormal lengthy bleeding since 1/26, no pain, reports clots with same
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
## 2021-10-09 NOTE — Telephone Encounter (Signed)
° ° °  Chief Complaint: Pt. Having her period since January Symptoms: Bleeding Frequency: Started in Jan. Pertinent Negatives: Patient denies pain Disposition: [] ED /[] Urgent Care (no appt availability in office) / [] Appointment(In office/virtual)/ []  [place] [facility]/ [] Home Care/ [] Refused Recommended Disposition /[] [facility]/ []  Follow-up with PCP Additional Notes: Pt. Going to [facility] hospital at [facility].  Reason for Disposition  Patient sounds very sick or weak to the triager  Answer Assessment - Initial Assessment Questions 1. AMOUNT: "Describe the bleeding that you are having."    - SPOTTING: spotting, or pinkish / brownish mucous discharge; does not fill panty liner or pad    - MILD:  less than 1 pad / hour; less than patient's usual menstrual bleeding   - MODERATE: 1-2 pads / hour; 1 menstrual cup every 6 hours; small-medium blood clots (e.g., pea, grape, small coin)   - SEVERE: soaking 2 or more pads/hour for 2 or more hours; 1 menstrual cup every 2 hours; bleeding not contained by pads or continuous red blood from vagina; large blood clots (e.g., golf ball, large coin)      Moderate 2. ONSET: "When did the bleeding begin?" "Is it continuing now?"     January 3. MENSTRUAL PERIOD: "When was the last normal menstrual period?" "How is this different than your period?"     November 4. REGULARITY: "How regular are your periods?"     Usually regular 5. ABDOMINAL PAIN: "Do you have any pain?" "How bad is the pain?"  (e.g., Scale 1-10; mild, moderate, or severe)   - MILD (1-3): doesn't interfere with normal activities, abdomen soft and not tender to touch    - MODERATE (4-7): interferes with normal activities or awakens from sleep, abdomen tender to touch    - SEVERE (8-10): excruciating pain, doubled over, unable to do any normal activities      Mild 6. PREGNANCY: "Could you be pregnant?" "Are you sexually active?" "Did you recently give birth?"     No 7.  BREASTFEEDING: "Are you breastfeeding?"     No 8. HORMONES: "Are you taking any hormone medications, prescription or OTC?" (e.g., birth control pills, estrogen)     No 9. BLOOD THINNERS: "Do you take any blood thinners?" (e.g., Coumadin/warfarin, Pradaxa/dabigatran, aspirin)     No 10. CAUSE: "What do you think is causing the bleeding?" (e.g., recent gyn surgery, recent gyn procedure; known bleeding disorder, cervical cancer, polycystic ovarian disease, fibroids)         Unsure 11. HEMODYNAMIC STATUS: "Are you weak or feeling lightheaded?" If Yes, ask: "Can you stand and walk normally?"        No 12. OTHER SYMPTOMS: "What other symptoms are you having with the bleeding?" (e.g., passed tissue, vaginal discharge, fever, menstrual-type cramps)       No  Protocols used: Vaginal Bleeding - Abnormal-A-AH

## 2021-10-09 NOTE — ED Provider Triage Note (Signed)
Emergency Medicine Provider Triage Evaluation Note  Tammie Byrd , a 21 y.o. female  was evaluated in triage.  Pt complains of vaginal bleeding.  States this started after [facility] which initially improved her symptoms until January 26 which was a time for her usual menstrual cycle and the bleeding was heavier and has not resolved since.  States she has been passing blood clots and has been going through 6 tampons per day.  States in the past couple days she has become lightheaded.  Denies vaginal discharge, pelvic pain, or other complaints.  Review of Systems  Positive: As above Negative: As above  Physical Exam  BP 132/86    Pulse (!) 44    Temp 98.2 F (36.8 C)    Resp 14    SpO2 (!) 83%  Gen:   Awake, no distress   Resp:  Normal effort  MSK:   Moves extremities without difficulty  Other:  Abdomen nontender nondistended.  Medical Decision Making  Medically screening exam initiated at 5:25 PM.  Appropriate orders placed.  Tammie Byrd was informed that the remainder of the evaluation will be completed by another provider, this initial triage assessment does not replace that evaluation, and the importance of remaining in the ED until their evaluation is complete.     Marita Kansas, PA-C 10/09/21 1726

## 2021-10-30 ENCOUNTER — Ambulatory Visit (INDEPENDENT_AMBULATORY_CARE_PROVIDER_SITE_OTHER): Payer: Medicaid Other | Admitting: Advanced Practice Midwife

## 2021-10-30 ENCOUNTER — Encounter: Payer: Self-pay | Admitting: Advanced Practice Midwife

## 2021-10-30 ENCOUNTER — Other Ambulatory Visit: Payer: Self-pay

## 2021-10-30 VITALS — BP 118/76 | HR 91 | Ht 66.0 in | Wt 201.0 lb

## 2021-10-30 DIAGNOSIS — A539 Syphilis, unspecified: Secondary | ICD-10-CM

## 2021-10-30 DIAGNOSIS — R102 Pelvic and perineal pain: Secondary | ICD-10-CM | POA: Diagnosis not present

## 2021-10-30 DIAGNOSIS — O364XX0 Maternal care for intrauterine death, not applicable or unspecified: Secondary | ICD-10-CM

## 2021-10-30 DIAGNOSIS — G8929 Other chronic pain: Secondary | ICD-10-CM

## 2021-10-30 DIAGNOSIS — N939 Abnormal uterine and vaginal bleeding, unspecified: Secondary | ICD-10-CM | POA: Diagnosis not present

## 2021-10-30 HISTORY — DX: Syphilis, unspecified: A53.9
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
## 2021-11-08 ENCOUNTER — Ambulatory Visit: Payer: Medicaid Other | Admitting: Physical Therapy

## 2021-11-23 ENCOUNTER — Ambulatory Visit: Payer: Medicaid Other | Attending: Advanced Practice Midwife | Admitting: Physical Therapy

## 2021-12-20 ENCOUNTER — Other Ambulatory Visit: Payer: Self-pay

## 2021-12-20 ENCOUNTER — Encounter (HOSPITAL_COMMUNITY): Payer: Self-pay | Admitting: Oncology

## 2021-12-20 ENCOUNTER — Emergency Department (HOSPITAL_COMMUNITY)
Admission: EM | Admit: 2021-12-20 | Discharge: 2021-12-20 | Disposition: A | Discharge status: Home / Self Care | Payer: Medicaid Other | Attending: Emergency Medicine | Admitting: Emergency Medicine

## 2021-12-20 DIAGNOSIS — K047 Periapical abscess without sinus: Secondary | ICD-10-CM | POA: Diagnosis not present

## 2021-12-20 DIAGNOSIS — K0889 Other specified disorders of teeth and supporting structures: Secondary | ICD-10-CM | POA: Diagnosis present

## 2021-12-20 MED ORDER — NAPROXEN 375 MG PO TABS
375.0000 mg | ORAL_TABLET | Freq: Two times a day (BID) | ORAL | 0 refills | Status: DC
Start: 2021-12-20 — End: 2023-03-18

## 2021-12-20 MED ORDER — AMOXICILLIN 500 MG PO CAPS: 500.0000 mg | ORAL_CAPSULE | Freq: Three times a day (TID) | ORAL | 0 refills | Status: DC

## 2021-12-20 NOTE — ED Notes (Signed)
Urine specimen obtained, placed in triage urine collection bin, holding for order.  

## 2021-12-20 NOTE — Discharge Instructions (Addendum)
You have been seen by your caregiver because of dental pain.  SEEK MEDICAL ATTENTION IF: The exam and treatment you received today has been provided on an emergency basis only. This is not a substitute for complete medical or dental care. If your problem worsens or new symptoms (problems) appear, and you are unable to arrange prompt follow-up care with your dentist, call or return to this location. CALL YOUR DENTIST OR RETURN IMMEDIATELY IF you develop a fever, rash, difficulty breathing or swallowing, neck or facial swelling, or other potentially serious concerns.    Free HIV and STD Testing These locations offer FREE confidential testing for HIV, Chlamydia, Gonorrhea, and Syphilis. Non-Traditional Testing Sites Address Telephone  [facility] [address], [place] [phone] Mondays 5pm - 7pm  [facility] [address] [place] [phone] Wednesdays 2pm-8pm  [facility] [address], [place] [phone] Thursdays 9am-12noon 1pm-4pm  [facility] [address], [place] [phone] Tuesdays Thursdays 9am-12noon 1pm-4pm  [facility] offers free, confidential testing and treatment for HIV, Chlamydia, Gonorrhea, Syphilis, Herpes, Bacterial Vaginosis, Yeast, and Trichomoniasis. Traditional Testing   [facility] [address], [place] [phone]  Monday thru Friday  Call for an appointment  [facility] [place] [facility] [address] [place] [phone] Monday thru Friday  Call for anappointment.  If you have any questions about this information please call [phone]. 07/05/2011  

## 2021-12-20 NOTE — ED Triage Notes (Signed)
Pt presents d/t left sided dental pain. Swelling noted. Pt reports pain began yesterday.  ?

## 2021-12-20 NOTE — ED Notes (Signed)
I provided reinforced discharge education based off of discharge instructions. Pt acknowledged and understood my education. Pt had no further questions/concerns for provider/myself.  °

## 2021-12-20 NOTE — ED Provider Notes (Signed)
?[facility]-EMERGENCY DEPT ?Provider Note ? ? ?CSN: [id number] ?Arrival date & time: 12/20/21  1341 ? ?  ? ?History ? ?Chief Complaint  ?Patient presents with  ? Dental Pain  ? ? ?Tammie Byrd is a 21 y.o. female who presents emergency department chief complaint of facial swelling and dental pain.  Rates the pain at 7 out of 10.  Patient pain began last night.  She describes it as aching, tight, worse with pressure.  No history of the same.  No difficulty swallowing, no fevers no chills.  She has not had dental care in some time.  Patient's pain is located in the left lower jaw. ? ? ?Dental Pain ? ?  ? ?Home Medications ?Prior to Admission medications   ?Medication Sig Start Date End Date Taking? Authorizing Provider  ?megestrol (MEGACE) 40 MG tablet Take 1 tablet (40 mg total) by mouth 2 (two) times daily. 10/09/21   Zadie Rhine, MD  ?   ? ?Allergies    ?Patient has no known allergies.   ? ?Review of Systems   ?Review of Systems ? ?Physical Exam ?Updated Vital Signs ?BP 113/88 (BP Location: Left Arm)   Pulse 90   Temp 98 ?F (36.7 ?C) (Oral)   Resp 18   Ht 5\' 5"  (1.651 m)   Wt 86.2 kg   LMP 12/03/2021 (Approximate)   SpO2 100%   BMI 31.62 kg/m?  ?Physical Exam ?Vitals and nursing note reviewed.  ?Constitutional:   ?   General: She is not in acute distress. ?   Appearance: She is well-developed. She is not diaphoretic.  ?HENT:  ?   Head: Normocephalic and atraumatic.  ?   Comments: Swelling over the left lower mandible ?Small dental caries visible in the bicuspid and first molar on the left lower side.  No obvious abscess, no hypoglossal swelling or tenderness, no fullness in the throat. ?   Right Ear: External ear normal.  ?   Left Ear: External ear normal.  ?   Nose: Nose normal.  ?   Mouth/Throat:  ?   Mouth: Mucous membranes are moist.  ?Eyes:  ?   General: No scleral icterus. ?   Conjunctiva/sclera: Conjunctivae normal.  ?Cardiovascular:  ?   Rate and Rhythm: Normal rate and  regular rhythm.  ?   Heart sounds: Normal heart sounds. No murmur heard. ?  No friction rub. No gallop.  ?Pulmonary:  ?   Effort: Pulmonary effort is normal. No respiratory distress.  ?   Breath sounds: Normal breath sounds.  ?Abdominal:  ?   General: Bowel sounds are normal. There is no distension.  ?   Palpations: Abdomen is soft. There is no mass.  ?   Tenderness: There is no abdominal tenderness. There is no guarding.  ?Musculoskeletal:  ?   Cervical back: Normal range of motion.  ?Skin: ?   General: Skin is warm and dry.  ?Neurological:  ?   Mental Status: She is alert and oriented to person, place, and time.  ?Psychiatric:     ?   Behavior: Behavior normal.  ? ? ?ED Results / Procedures / Treatments   ?Labs ?(all labs ordered are listed, but only abnormal results are displayed) ?Labs Reviewed - No data to display ? ?EKG ?None ? ?Radiology ?No results found. ? ?Procedures ?Procedures  ? ? ?Medications Ordered in ED ?Medications - No data to display ? ?ED Course/ Medical Decision Making/ A&P ?  ?                        ?  Medical Decision Making ? ?Patient with toothache and likely periapical abscess.  No gross abscess amenable to I&D.  Exam unconcerning for Ludwig's angina or spread of infection.  Will treat with amoxicillin and pain medicine.  Urged patient to follow-up with dentist.   ? ?Final Clinical Impression(s) / ED Diagnoses ?Final diagnoses:  ?Dental infection  ? ? ?Rx / DC Orders ?ED Discharge Orders   ? ? None  ? ?  ? ? ?  ?Arthor Captain, PA-C ?12/20/21 1441 ? ?  ?Pricilla Loveless, MD ?12/20/21 1526 ? ?

## 2022-01-01 ENCOUNTER — Ambulatory Visit: Payer: Medicaid Other | Admitting: Advanced Practice Midwife

## 2022-01-04 ENCOUNTER — Encounter (HOSPITAL_COMMUNITY): Payer: Self-pay

## 2022-01-04 ENCOUNTER — Emergency Department (HOSPITAL_COMMUNITY)
Admission: EM | Admit: 2022-01-04 | Discharge: 2022-01-04 | Disposition: A | Discharge status: Home / Self Care | Payer: Medicaid Other | Attending: Emergency Medicine | Admitting: Emergency Medicine

## 2022-01-04 ENCOUNTER — Other Ambulatory Visit: Payer: Self-pay

## 2022-01-04 DIAGNOSIS — K0889 Other specified disorders of teeth and supporting structures: Secondary | ICD-10-CM | POA: Diagnosis present

## 2022-01-04 DIAGNOSIS — K029 Dental caries, unspecified: Secondary | ICD-10-CM | POA: Diagnosis not present

## 2022-01-04 LAB — POC URINE PREG, ED: Preg Test, Ur: NEGATIVE

## 2022-01-04 MED ORDER — CLINDAMYCIN HCL 150 MG PO CAPS
300.0000 mg | ORAL_CAPSULE | Freq: Three times a day (TID) | ORAL | 0 refills | Status: AC
Start: 2022-01-04 — End: 2022-01-11

## 2022-01-04 MED ORDER — HYDROCODONE-ACETAMINOPHEN 5-325 MG PO TABS
1.0000 | ORAL_TABLET | Freq: Once | ORAL | Status: AC
  Administered 2022-01-04: 1 via ORAL
  Filled 2022-01-04: qty 1

## 2022-01-04 NOTE — ED Triage Notes (Signed)
Patient c/o left lower dental pain and swelling x 2 days. ?

## 2022-01-04 NOTE — ED Provider Notes (Signed)
?[facility]-EMERGENCY DEPT ?Provider Note ? ? ?CSN: [id number] ?Arrival date & time: 01/04/22  0865 ? ?  ? ?History ? ?Chief Complaint  ?Patient presents with  ? Dental Pain  ? ? ?Tammie Byrd is a 21 y.o. female who presents to the ED today with complaint of continued L lower dental pain for the past 1-2 weeks.  Per chart review patient was seen in the ED on 04/26 for dental infection.  She was discharged home with amoxicillin and advised to follow-up with dentistry.  She states that she has finished the antibiotic and has taken the naproxen without relief.  She has not followed up with dentistry however she states that she needs to pay out-of-pocket.  She returns today for continued pain.  States that she has pain with eating, cold air, cold water.  Denies any facial swelling.  No fevers or chills. ? ?The history is provided by the patient and medical records.  ? ?  ? ?Home Medications ?Prior to Admission medications   ?Medication Sig Start Date End Date Taking? Authorizing Provider  ?clindamycin (CLEOCIN) 150 MG capsule Take 2 capsules (300 mg total) by mouth 3 (three) times daily for 7 days. 01/04/22 01/11/22 Yes Chauntay Paszkiewicz, PA-C  ?amoxicillin (AMOXIL) 500 MG capsule Take 1 capsule (500 mg total) by mouth 3 (three) times daily. 12/20/21   Arthor Captain, PA-C  ?megestrol (MEGACE) 40 MG tablet Take 1 tablet (40 mg total) by mouth 2 (two) times daily. 10/09/21   Zadie Rhine, MD  ?naproxen (NAPROSYN) 375 MG tablet Take 1 tablet (375 mg total) by mouth 2 (two) times daily with a meal. 12/20/21   Arthor Captain, PA-C  ?   ? ?Allergies    ?Patient has no known allergies.   ? ?Review of Systems   ?Review of Systems  ?Constitutional:  Negative for chills and fever.  ?HENT:  Positive for dental problem. Negative for facial swelling.   ?All other systems reviewed and are negative. ? ?Physical Exam ?Updated Vital Signs ?BP (!) 146/108 (BP Location: Left Arm)   Pulse 89   Temp 98.5 ?F (36.9 ?C)  (Oral)   Resp 18   Ht 5\' 5"  (1.651 m)   Wt 86.2 kg   SpO2 95%   BMI 31.62 kg/m?  ?Physical Exam ?Vitals and nursing note reviewed.  ?Constitutional:   ?   Appearance: She is not ill-appearing.  ?HENT:  ?   Head: Normocephalic and atraumatic.  ?   Mouth/Throat:  ?   Comments: Nose clear.  ?L lower tooth #20 and 21 with caries with TTP, with minimal surrounding gingival swelling and erythema, no definite abscess, no evidence of ludwig's.  ?Oropharynx clear and moist, without uvular swelling or deviation, no trismus or drooling, no tonsillar swelling or erythema, no exudates.   ?Eyes:  ?   Conjunctiva/sclera: Conjunctivae normal.  ?Cardiovascular:  ?   Rate and Rhythm: Normal rate and regular rhythm.  ?Pulmonary:  ?   Effort: Pulmonary effort is normal.  ?   Breath sounds: Normal breath sounds.  ?Skin: ?   General: Skin is warm and dry.  ?   Coloration: Skin is not jaundiced.  ?Neurological:  ?   Mental Status: She is alert.  ? ? ?ED Results / Procedures / Treatments   ?Labs ?(all labs ordered are listed, but only abnormal results are displayed) ?Labs Reviewed  ?POC URINE PREG, ED  ? ? ?EKG ?None ? ?Radiology ?No results found. ? ?Procedures ?Procedures  ? ? ?  Medications Ordered in ED ?Medications  ?HYDROcodone-acetaminophen (NORCO/VICODIN) 5-325 MG per tablet 1 tablet (has no administration in time range)  ? ? ?ED Course/ Medical Decision Making/ A&P ?  ?                        ?Medical Decision Making ?21 year old female presents to the ED today with continued left lower dental pain for the past 1 to 2 weeks.  Has been prescribed amoxicillin and taken for same without relief.  Has not followed up with dentistry.  On arrival to the ED today vitals are stable.  Patient has ice pack to her left lower area.  She is noted to have dental caries without signs of definitive abscess to be drained or concern for Ludwig's angina today.  She reports that she has been having irregular periods and is unsure if she could be  pregnant today.  We will plan for point-of-care pregnancy test prior to peripheral biting stronger pain medication in the ED however I do not feel she needs narcotics to go home with.  She is strongly encouraged to follow-up with dentistry for further eval -we will plan to discharge home with clindamycin for additional antibiotic coverage. ? ?POC urine preg negative. Pt provided with norco in the ED. She is discharged home with clindamycin. She has been provided with dentist on call today as well as dental resource guide. She is in agreement with plan and stable for discharge home.  ? ?Risk ?Prescription drug management. ? ? ? ? ? ? ? ? ? ?Final Clinical Impression(s) / ED Diagnoses ?Final diagnoses:  ?Pain, dental  ? ? ?Rx / DC Orders ?ED Discharge Orders   ? ?      Ordered  ?  clindamycin (CLEOCIN) 150 MG capsule  3 times daily       ? 01/04/22 1219  ? ?  ?  ? ?  ? ? ? ?Discharge Instructions   ? ?  ?It is recommended that you follow up with a dentist for further evaluation of your ongoing dental pain. You can follow up with Dr. Mia Creek our dentist on call or any of the attached dentists in the dental resource guide. You may likely need to pay out of pocket to be seen.  ? ?Pick up antibiotic and take as prescribed.  ?Continue taking Ibuprofen and Tylenol as needed for pain. You can take 800 mg Ibuprofen (4 OTC tablets) every 8 hours as needed for pain and 1,000 mg Tylenol (2 OTC double strength tablets) every 8 hours as needed for pain. Alternate each medication every 4 hours to stay on top of the pain.  ? ? ? ? ?  ?Tanda Rockers, PA-C ?01/04/22 1221 ? ?  ?Terrilee Files, MD ?01/04/22 1820 ? ?

## 2022-01-04 NOTE — Discharge Instructions (Addendum)
It is recommended that you follow up with a dentist for further evaluation of your ongoing dental pain. You can follow up with Dr. Mia Creek our dentist on call or any of the attached dentists in the dental resource guide. You may likely need to pay out of pocket to be seen.  ? ?Pick up antibiotic and take as prescribed.  ?Continue taking Ibuprofen and Tylenol as needed for pain. You can take 800 mg Ibuprofen (4 OTC tablets) every 8 hours as needed for pain and 1,000 mg Tylenol (2 OTC double strength tablets) every 8 hours as needed for pain. Alternate each medication every 4 hours to stay on top of the pain.  ?

## 2022-01-04 NOTE — ED Notes (Signed)
An After Visit Summary was printed and given to the patient. Discharge instructions given and no further questions at this time.  Pt states her mother is driving her home.  

## 2022-06-21 IMAGING — DX DG CHEST 1V PORT
1 series · 1 of 1 positions shown · non-contrast
Comparison: None.

CLINICAL DATA: COVID, shortness of breath

EXAM:
PORTABLE CHEST 1 VIEW

[chest]
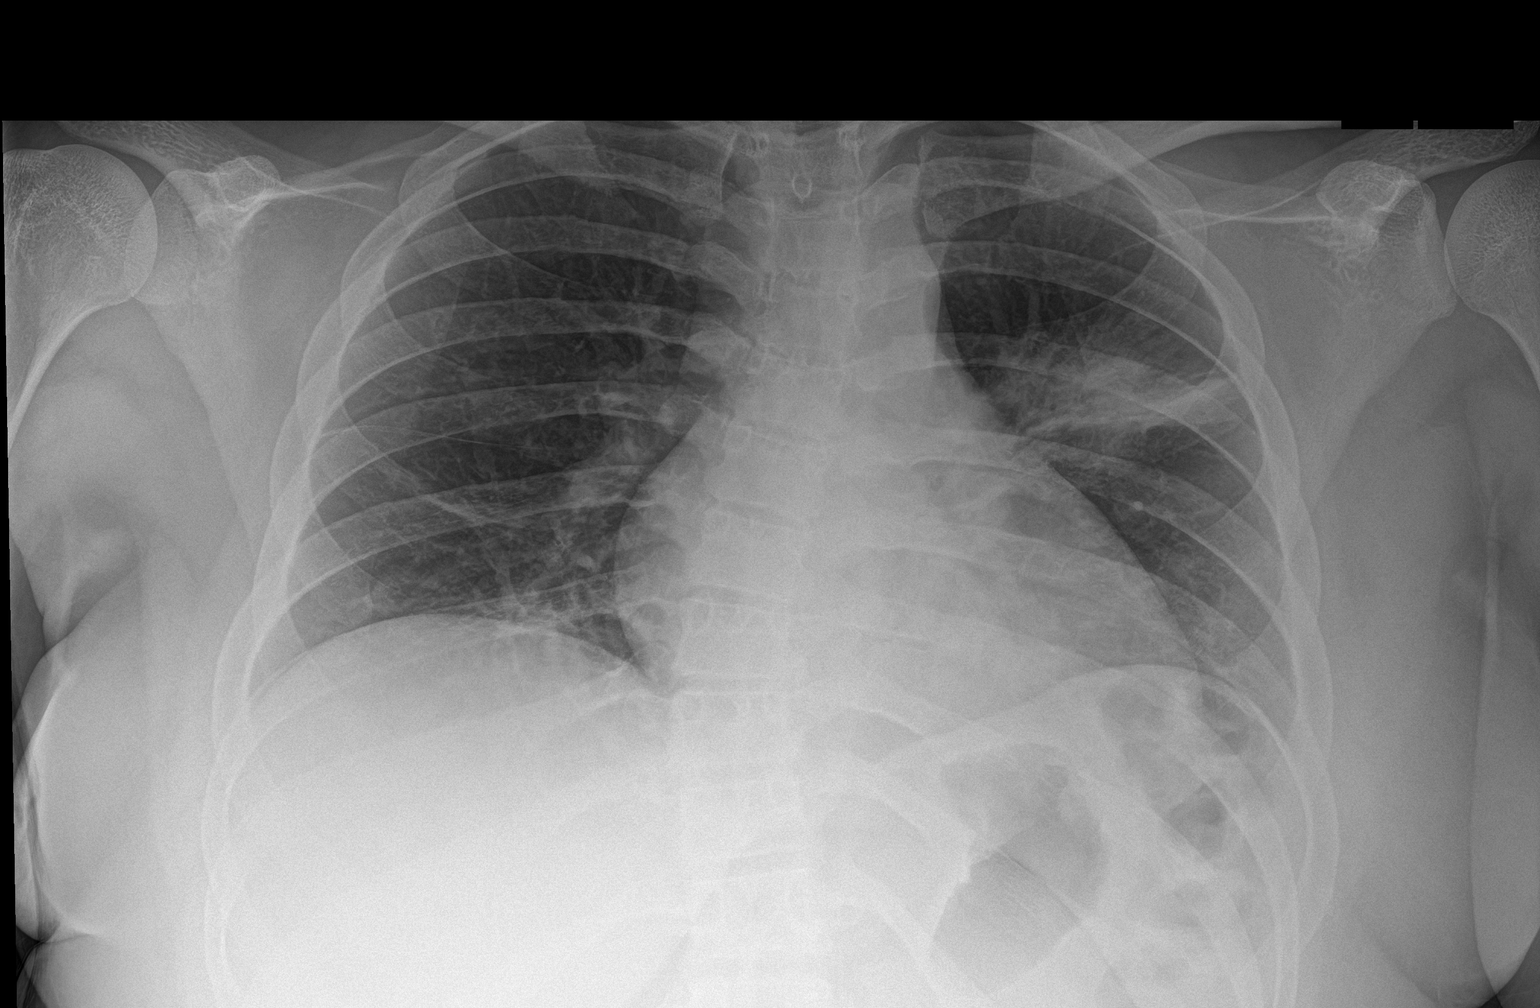

[1 of 1 positions shown; findings below may reference images not displayed]

FINDINGS: Airspace consolidation noted in the left upper lobe. Patchy
opacities in the lung bases. Heart is normal size. Low lung volumes.
No effusions or acute bony abnormality.
IMPRESSION: Patchy left upper lobe and bibasilar opacities concerning for
pneumonia.

## 2022-08-24 ENCOUNTER — Emergency Department (HOSPITAL_COMMUNITY)
Admission: EM | Admit: 2022-08-24 | Discharge: 2022-08-24 | Discharge status: Against Medical Advice | Payer: Medicaid Other

## 2022-08-24 NOTE — ED Notes (Signed)
Patient called 3x for triage. No reply.

## 2022-09-03 ENCOUNTER — Other Ambulatory Visit (HOSPITAL_COMMUNITY)
Admission: RE | Admit: 2022-09-03 | Discharge: 2022-09-03 | Disposition: A | Discharge status: Home / Self Care | Payer: Medicaid Other | Source: Ambulatory Visit | Attending: Obstetrics and Gynecology | Admitting: Obstetrics and Gynecology

## 2022-09-03 ENCOUNTER — Ambulatory Visit (INDEPENDENT_AMBULATORY_CARE_PROVIDER_SITE_OTHER): Payer: Medicaid Other

## 2022-09-03 VITALS — BP 129/86 | HR 83 | Ht 66.0 in | Wt 209.7 lb

## 2022-09-03 DIAGNOSIS — Z113 Encounter for screening for infections with a predominantly sexual mode of transmission: Secondary | ICD-10-CM

## 2022-09-03 DIAGNOSIS — N926 Irregular menstruation, unspecified: Secondary | ICD-10-CM

## 2022-09-03 LAB — POCT URINE PREGNANCY: Preg Test, Ur: NEGATIVE

## 2022-09-03 NOTE — Progress Notes (Addendum)
SUBJECTIVE:  22 y.o. female denies abnormal vaginal bleeding or significant pelvic pain or fever. No UTI symptoms. Denies history of known exposure to STD. Patient requesting STD screening done and HCG blood test.   Patient's last menstrual period was 07/05/2022 (approximate).  OBJECTIVE:  She appears well, afebrile.   ASSESSMENT:  UPT: Negative  PLAN:  GC, chlamydia, trichomonas probe sent to lab. Treatment: To be determined once lab results are received ROV prn if symptoms persist or worsen.  Patient to follow-up with provider regarding irregular periods.

## 2022-09-04 LAB — CERVICOVAGINAL ANCILLARY ONLY
Chlamydia: NEGATIVE
Comment: NEGATIVE
Comment: NEGATIVE
Comment: NORMAL
Neisseria Gonorrhea: NEGATIVE
Trichomonas: NEGATIVE

## 2022-09-05 LAB — HIV ANTIBODY (ROUTINE TESTING W REFLEX): HIV Screen 4th Generation wRfx: NONREACTIVE

## 2022-09-05 LAB — HEPATITIS B SURFACE ANTIGEN: Hepatitis B Surface Ag: NEGATIVE

## 2022-09-05 LAB — RPR, QUANT+TP ABS (REFLEX)
Rapid Plasma Reagin, Quant: 1:4 {titer} — ABNORMAL HIGH
T Pallidum Abs: REACTIVE — AB

## 2022-09-05 LAB — RPR: RPR Ser Ql: REACTIVE — AB

## 2022-09-05 LAB — HEPATITIS C ANTIBODY: Hep C Virus Ab: NONREACTIVE

## 2022-09-05 LAB — BETA HCG QUANT (REF LAB): hCG Quant: <1 m[IU]/mL

## 2022-09-10 ENCOUNTER — Telehealth: Payer: Self-pay

## 2022-09-10 NOTE — Telephone Encounter (Signed)
Attempted to follow up about and +RPR results and treatment.  No answer, vm is not setup Pt has appt tomorrow

## 2022-09-11 ENCOUNTER — Ambulatory Visit: Payer: Medicaid Other | Admitting: Obstetrics

## 2022-09-11 ENCOUNTER — Telehealth: Payer: Self-pay | Admitting: *Deleted

## 2022-09-11 NOTE — Telephone Encounter (Signed)
2nd attempt to reach pt regarding Syphilis DX and need for immediate TX. No answer. VM full. Message sent via [facility] pt of DX (pt has seen results), and of need for immediate TX at the [facility]. Advised that the disease was reported to the [facility] per state law. Education included in message. Request for pt call back to confirm knowledge of DX and intention to seek [facility] made. STD Report sent to [facility] Attention: Grayce Sessions. Advised that we have been unable to reach the pt.

## 2022-09-13 ENCOUNTER — Telehealth: Payer: Self-pay | Admitting: *Deleted

## 2022-09-13 ENCOUNTER — Ambulatory Visit: Payer: Medicaid Other | Admitting: Obstetrics

## 2022-09-13 ENCOUNTER — Encounter: Payer: Self-pay | Admitting: *Deleted

## 2022-09-13 NOTE — Telephone Encounter (Signed)
3rd attempt to reach pt to discuss Syphilis DX, and ensure TX has been received. No answer. VM not set up. MyChart message previously sent. Physical letter will be sent to address on file.

## 2022-10-02 ENCOUNTER — Ambulatory Visit (INDEPENDENT_AMBULATORY_CARE_PROVIDER_SITE_OTHER): Payer: Medicaid Other | Admitting: Obstetrics

## 2022-10-02 ENCOUNTER — Encounter: Payer: Self-pay | Admitting: Obstetrics

## 2022-10-02 ENCOUNTER — Other Ambulatory Visit (HOSPITAL_COMMUNITY)
Admission: RE | Admit: 2022-10-02 | Discharge: 2022-10-02 | Disposition: A | Discharge status: Home / Self Care | Payer: Medicaid Other | Source: Ambulatory Visit | Attending: Obstetrics | Admitting: Obstetrics

## 2022-10-02 VITALS — BP 134/80 | HR 82 | Wt 214.0 lb

## 2022-10-02 DIAGNOSIS — A539 Syphilis, unspecified: Secondary | ICD-10-CM

## 2022-10-02 DIAGNOSIS — Z3202 Encounter for pregnancy test, result negative: Secondary | ICD-10-CM | POA: Diagnosis not present

## 2022-10-02 DIAGNOSIS — N926 Irregular menstruation, unspecified: Secondary | ICD-10-CM

## 2022-10-02 DIAGNOSIS — Z01419 Encounter for gynecological examination (general) (routine) without abnormal findings: Secondary | ICD-10-CM | POA: Insufficient documentation

## 2022-10-02 DIAGNOSIS — E669 Obesity, unspecified: Secondary | ICD-10-CM

## 2022-10-02 DIAGNOSIS — G8929 Other chronic pain: Secondary | ICD-10-CM

## 2022-10-02 LAB — POCT URINE PREGNANCY: Preg Test, Ur: NEGATIVE

## 2022-10-02 MED ORDER — MEDROXYPROGESTERONE ACETATE 10 MG PO TABS: 10.0000 mg | ORAL_TABLET | Freq: Every day | ORAL | 0 refills | Status: DC

## 2022-10-02 NOTE — Progress Notes (Signed)
Pt presents for no cycles since Nov. She is requesting a referral to PT for pelvic pain therapy.  Pt has questions regarding +RPR

## 2022-10-02 NOTE — Progress Notes (Signed)
Subjective:        Tammie Byrd is a 22 y.o. female here for a routine exam.  Current complaints: Occasional pelvic pain in the hip joints.  She had an eight month stillbirth 3 year ago and had pelvic symphysis separation that caused this same type pain.  She is requesting referral for pelvic floor PT.  She was treated for Syphilis 3 years ago wi th a RPR tit er of 256:1.  Her RPR titer is now 1:4.  Personal health questionnaire:  Is patient Ashkenazi Jewish, have a family history of breast and/or ovarian cancer: no Is there a family history of uterine cancer diagnosed at age < 19, gastrointestinal cancer, urinary tract cancer, family member who is a Field seismologist syndrome-associated carrier: no Is the patient overweight and hypertensive, family history of diabetes, personal history of gestational diabetes, preeclampsia or PCOS: no Is patient over 47, have PCOS,  family history of premature CHD under age 52, diabetes, smoke, have hypertension or peripheral artery disease:  no At any time, has a partner hit, kicked or otherwise hurt or frightened you?: no Over the past 2 weeks, have you felt down, depressed or hopeless?: no Over the past 2 weeks, have you felt little interest or pleasure in doing things?:no   Gynecologic History Patient's last menstrual period was 07/05/2022 (approximate). Contraception: none Last Pap: 2020. Results were: normal Last mammogram: n/a. Results were: n/a  Obstetric History OB History  Gravida Para Term Preterm AB Living  2 2 1 1   1   SAB IAB Ectopic Multiple Live Births        0 1    # Outcome Date GA Lbr Len/2nd Weight Sex Delivery Anes PTL Lv  2 Preterm 07/24/19 [redacted]w[redacted]d 03:20 4 lb 10.4 oz (2.109 kg) F Vag-Spont None  FD  1 Term 05/02/17 [redacted]w[redacted]d   M Vag-Spont   LIV    Past Medical History:  Diagnosis Date   Pregnancy induced hypertension     Past Surgical History:  Procedure Laterality Date   TONSILLECTOMY       Current Outpatient Medications:     medroxyPROGESTERone (PROVERA) 10 MG tablet, Take 1 tablet (10 mg total) by mouth daily., Disp: 10 tablet, Rfl: 0   amoxicillin (AMOXIL) 500 MG capsule, Take 1 capsule (500 mg total) by mouth 3 (three) times daily. (Patient not taking: Reported on 09/03/2022), Disp: 21 capsule, Rfl: 0   megestrol (MEGACE) 40 MG tablet, Take 1 tablet (40 mg total) by mouth 2 (two) times daily. (Patient not taking: Reported on 09/03/2022), Disp: 56 tablet, Rfl: 0   naproxen (NAPROSYN) 375 MG tablet, Take 1 tablet (375 mg total) by mouth 2 (two) times daily with a meal. (Patient not taking: Reported on 09/03/2022), Disp: 20 tablet, Rfl: 0 No Known Allergies  Social History   Tobacco Use   Smoking status: Never    Passive exposure: Never   Smokeless tobacco: Never  Substance Use Topics   Alcohol use: No    Family History  Problem Relation Age of Onset   Hypertension Father    Cancer Brother    Early death Brother    Cancer Maternal Grandmother    Diabetes Maternal Grandfather    Hypertension Maternal Grandfather       Review of Systems  Constitutional: negative for fatigue and weight loss Respiratory: negative for cough and wheezing Cardiovascular: negative for chest pain, fatigue and palpitations Gastrointestinal: negative for abdominal pain and change in bowel habits Musculoskeletal:negative for myalgias Neurological:  negative for gait problems and tremors Behavioral/Psych: negative for abusive relationship, depression Endocrine: negative for temperature intolerance    Genitourinary:negative for abnormal menstrual periods, genital lesions, hot flashes, sexual problems and vaginal discharge Integument/breast: negative for breast lump, breast tenderness, nipple discharge and skin lesion(s)    Objective:       BP 134/80   Pulse 82   Wt 214 lb (97.1 kg)   LMP 07/05/2022 (Approximate)   BMI 34.54 kg/m  General:   Alert and no distress  Skin:   no rash or abnormalities  Lungs:   clear to  auscultation bilaterally  Heart:   regular rate and rhythm, S1, S2 normal, no murmur, click, rub or gallop  Breasts:   normal without suspicious masses, skin or nipple changes or axillary nodes  Abdomen:  normal findings: no organomegaly, soft, non-tender and no hernia  Pelvis:  External genitalia: normal general appearance Urinary system: urethral meatus normal and bladder without fullness, nontender Vaginal: normal without tenderness, induration or masses Cervix: normal appearance Adnexa: normal bimanual exam Uterus: anteverted and non-tender, normal size   Lab Review Urine pregnancy test Labs reviewed yes Radiologic studies reviewed no  I have spent a total of 20 minutes of face-to-face time, excluding clinical staff time, reviewing notes and preparing to see patient, ordering tests and/or medications, and counseling the patient.   Assessment:    1. Encounter for gynecological examination with Papanicolaou smear of cervix  Rx: - Cytology - PAP( [place])  2. Missed periods Rx: - POCT urine pregnancy - medroxyPROGESTERone (PROVERA) 10 MG tablet; Take 1 tablet (10 mg total) by mouth daily.  Dispense: 10 tablet; Refill: 0  3. Chronic pelvic pain in female - will refer for Pelvic Floor PT  4. 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 Obesity (BMI 30.0-34.9) - weight reduction recommended     Plan:    Education reviewed: calcium supplements, depression evaluation, low fat, low cholesterol diet, safe sex/STD prevention, self breast exams, and weight bearing exercise. Contraception: none. Follow up in: 4 weeks.   Meds ordered this encounter  Medications   medroxyPROGESTERone (PROVERA) 10 MG tablet    Sig: Take 1 tablet (10 mg total) by mouth daily.     Dispense:  10 tablet    Refill:  0   Orders Placed This Encounter  Procedures   Ambulatory referral to Infectious Disease    Referral Priority:   Routine    Referral Type:   Consultation    Referral Reason:   Specialty Services Required    Requested Specialty:   Infectious Diseases    Number of Visits Requested:   1   POCT urine pregnancy     Shelly Bombard, MD 10/02/2022 11:52 AM

## 2022-10-04 LAB — CYTOLOGY - PAP

## 2022-10-30 ENCOUNTER — Telehealth (INDEPENDENT_AMBULATORY_CARE_PROVIDER_SITE_OTHER): Payer: Medicaid Other | Admitting: Obstetrics

## 2022-10-30 ENCOUNTER — Encounter: Payer: Self-pay | Admitting: Obstetrics

## 2022-10-30 DIAGNOSIS — N8189 Other female genital prolapse: Secondary | ICD-10-CM

## 2022-10-30 DIAGNOSIS — R102 Pelvic and perineal pain: Secondary | ICD-10-CM | POA: Diagnosis not present

## 2022-10-30 DIAGNOSIS — R87612 Low grade squamous intraepithelial lesion on cytologic smear of cervix (LGSIL): Secondary | ICD-10-CM | POA: Diagnosis not present

## 2022-10-30 DIAGNOSIS — E669 Obesity, unspecified: Secondary | ICD-10-CM

## 2022-10-30 DIAGNOSIS — G8929 Other chronic pain: Secondary | ICD-10-CM

## 2022-10-30 NOTE — Progress Notes (Signed)
    GYNECOLOGY VIRTUAL VISIT ENCOUNTER NOTE  Provider location: [facility]   Patient location: Home  I connected with Tammie Byrd on 10/30/22 at  3:30 PM EST by MyChart Video Encounter and verified that I am speaking with the correct person using two identifiers.   I discussed the limitations, risks, security and privacy concerns of performing an evaluation and management service virtually and the availability of in person appointments. I also discussed with the patient that there may be a patient responsible charge related to this service. The patient expressed understanding and agreed to proceed.   History:  Tammie Byrd is a 22 y.o. [id number] female being evaluated today for follow for pelvic floor relaxation.  She states that she feels a popping sensation in the vaginal floor.  She denies any abnormal vaginal discharge, bleeding, pelvic pain or other concerns.       Past Medical History:  Diagnosis Date   Pregnancy induced hypertension    Past Surgical History:  Procedure Laterality Date   TONSILLECTOMY     The following portions of the patient's history were reviewed and updated as appropriate: allergies, current medications, past family history, past medical history, past social history, past surgical history and problem list.   Health Maintenance:  LGSILpap on 10-02-2022.    Review of Systems:  Pertinent items noted in HPI and remainder of comprehensive ROS otherwise negative.  Physical Exam:   General:  Alert, oriented and cooperative. Patient appears to be in no acute distress.  Mental Status: Normal mood and affect. Normal behavior. Normal judgment and thought content.   Respiratory: Normal respiratory effort, no problems with respiration noted  Rest of physical exam deferred due to type of encounter  Labs and Imaging No results found for this or any previous visit (from the past 336 hour(s)). No results found.     Assessment and Plan:       1. Pelvic floor relaxation Rx: - Ambulatory referral to Urogynecology  2. Chronic pelvic pain in female - Ibuprofen prn  3. LGSIL of cervix of undetermined significance - Colposcopy scheduled  4. Obesity (BMI 30.0-34.9) - weight reduction recommended      I discussed the assessment and treatment plan with the patient. The patient was provided an opportunity to ask questions and all were answered. The patient agreed with the plan and demonstrated an understanding of the instructions.   The patient was advised to call back or seek an in-person evaluation/go to the ED if the symptoms worsen or if the condition fails to improve as anticipated.  I have spent a total of 10 minutes of non-face-to-face time, excluding clinical staff time, reviewing notes and preparing to see patient, ordering tests and/or medications, and counseling the patient.    Baltazar Najjar, MD [facility] for [facility], [place], [facility] 10/30/22

## 2022-11-06 ENCOUNTER — Encounter: Payer: Self-pay | Admitting: Obstetrics & Gynecology

## 2022-11-08 ENCOUNTER — Encounter: Payer: Medicaid Other | Admitting: Obstetrics & Gynecology

## 2022-11-13 ENCOUNTER — Other Ambulatory Visit: Payer: Self-pay | Admitting: Emergency Medicine

## 2022-11-13 DIAGNOSIS — N926 Irregular menstruation, unspecified: Secondary | ICD-10-CM

## 2022-11-13 MED ORDER — MEDROXYPROGESTERONE ACETATE 10 MG PO TABS: 10.0000 mg | ORAL_TABLET | Freq: Every day | ORAL | 0 refills | Status: DC

## 2022-11-13 NOTE — Progress Notes (Signed)
Rx refill for Provera.

## 2022-11-16 ENCOUNTER — Other Ambulatory Visit: Payer: Self-pay | Admitting: Obstetrics

## 2022-11-16 ENCOUNTER — Telehealth: Payer: Medicaid Other | Admitting: Obstetrics & Gynecology

## 2022-12-09 NOTE — Progress Notes (Deleted)
[facility] New Patient Evaluation and Consultation  Referring Provider: Brock Bad, MD PCP: Patient, No Pcp Per Date of Service: 12/10/2022  SUBJECTIVE Chief Complaint: No chief complaint on file.  History of Present Illness: Tammie Byrd is a 22 y.o. {ED SANE [phone] female seen in consultation at the request of Dr. Clearance Coots for evaluation of ***.    ***Review of records significant for: ***  Urinary Symptoms: {urine leakage?:24754} Leaks *** time(s) per {days/wks/mos/yrs:310907}.  Pad use: {NUMBERS 1-10:18281} {pad option:24752} per day.   She {ACTION; IS/IS XYI:01655374} bothered by her UI symptoms.  Day time voids ***.  Nocturia: *** times per night to void. Voiding dysfunction: she {empties:24755} her bladder well.  {DOES NOT does:27190::"does not"} use a catheter to empty bladder.  When urinating, she feels {urine symptoms:24756} Drinks: *** per day  UTIs: {NUMBERS 1-10:18281} UTI's in the last year.   {ACTIONS;DENIES/REPORTS:21021675::"Denies"} history of {urologic concerns:24757}  Pelvic Organ Prolapse Symptoms:                  She {denies/ admits to:24761} a feeling of a bulge the vaginal area. It has been present for {NUMBER 1-10:22536} {days/wks/mos/yrs:310907}.  She {denies/ admits to:24761} seeing a bulge.  This bulge {ACTION; IS/IS MOL:07867544} bothersome.  Bowel Symptom: Bowel movements: *** time(s) per {Time; day/week/month:13537} Stool consistency: {stool consistency:24758} Straining: {yes/no:19897}.  Splinting: {yes/no:19897}.  Incomplete evacuation: {yes/no:19897}.  She {denies/ admits to:24761} accidental bowel leakage / fecal incontinence  Occurs: *** time(s) per {Time; day/week/month:13537}  Consistency with leakage: {stool consistency:24758} Bowel regimen: {bowel regimen:24759} Last colonoscopy: Date ***, Results ***  Sexual Function Sexually active: {yes/no:19897}.  Sexual orientation: {Sexual  [phone]} Pain with sex: {pain with sex:24762}  Pelvic Pain {denies/ admits to:24761} pelvic pain Location: *** Pain occurs: *** Prior pain treatment: *** Improved by: *** Worsened by: ***   Past Medical History:  Past Medical History:  Diagnosis Date  . Pregnancy induced hypertension   . 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 Severe preeclampsia 05/02/2017     Past Surgical History:   Past Surgical History:  Procedure Laterality Date  . TONSILLECTOMY       Past OB/GYN History: G{NUMBERS 1-10:18281} P{NUMBERS 1-10:18281} Vaginal deliveries: ***,  Forceps/ Vacuum deliveries: ***, Cesarean section: *** Menopausal: {menopausal:24763} Contraception: ***. Last pap smear was ***.  Any history of abnormal pap smears: {yes/no:19897}.   Medications: She has a current medication list which includes the following prescription(s): amoxicillin, medroxyprogesterone, megestrol, and naproxen.   Allergies: Patient has No Known Allergies.   Social History:  Social History   Tobacco Use  . Smoking status: Never    Passive exposure: Never  . Smokeless tobacco: Never  Vaping Use  . Vaping Use: Never used  Substance Use Topics  . Alcohol use: No  . Drug use: No    Relationship status: {relationship status:24764} She lives with ***.   She {ACTION; IS/IS BEE:10071219} employed ***. Regular exercise: {Yes/No:304960894} History of abuse: {Yes/No:304960894}  Family History:   Family History  Problem Relation Age of Onset  . Hypertension Father   . Cancer Brother   . Early death Brother   . Cancer Maternal Grandmother   . Diabetes Maternal Grandfather   . Hypertension Maternal Grandfather      Review of Systems: ROS   OBJECTIVE Physical Exam: There were no vitals filed for this visit.  Physical Exam   GU / Detailed Urogynecologic Evaluation:  Pelvic Exam:  Normal external female  genitalia; Bartholin's and Skene's glands normal in appearance; urethral meatus normal in appearance, no urethral masses or discharge.   CST: {gen negative/positive:315881}  Reflexes: bulbocavernosis {DESC; PRESENT/NOT PRESENT:21021351}, anocutaneous {DESC; PRESENT/NOT PRESENT:21021351} ***bilaterally.  Speculum exam reveals normal vaginal mucosa {With/Without:20273} atrophy. Cervix {exam; gyn cervix:30847}. Uterus {exam; pelvic uterus:30849}. Adnexa {exam; adnexa:12223}.    s/p hysterectomy: Speculum exam reveals normal vaginal mucosa {With/Without:20273}  atrophy and normal vaginal cuff.  Adnexa {exam; adnexa:12223}.    With apex supported, anterior compartment defect was {reduced:24765}  Pelvic floor strength {Roman # I-V:19040}/V, puborectalis {Roman # I-V:19040}/V external anal sphincter {Roman # I-V:19040}/V  Pelvic floor musculature: Right levator {Tender/Non-tender:20250}, Right obturator {Tender/Non-tender:20250}, Left levator {Tender/Non-tender:20250}, Left obturator {Tender/Non-tender:20250}  POP-Q:   POP-Q                                               Aa                                               Ba                                                 C                                                Gh                                               Pb                                               tvl                                                Ap                                               Bp                                                 D      Rectal Exam:  Normal sphincter tone, {rectocele:24766} distal rectocele, enterocoele {DESC; PRESENT/NOT PRESENT:21021351}, no rectal masses, {sign of:24767} dyssynergia when asking the patient to bear down.  Post-Void Residual (PVR) by Bladder Scan: In order to evaluate  bladder emptying, we discussed obtaining a postvoid residual and she agreed to this procedure.  Procedure: The ultrasound unit was placed on  the patient's abdomen in the suprapubic region after the patient had voided. A PVR of *** ml was obtained by bladder scan.  Laboratory Results: @   ***I visualized the urine specimen, noting the specimen to be {urine color:24768}  ASSESSMENT AND PLAN Tammie Byrd is a 22 y.o. with: No diagnosis found.    Selmer Dominion, NP   Medical Decision Making:  - Reviewed/ ordered a clinical laboratory test - Reviewed/ ordered a radiologic study - Reviewed/ ordered medicine test - Decision to obtain old records - Discussion of management of or test interpretation with an external physician / other healthcare professional  - Assessment requiring independent historian - Review and summation of prior records - Independent review of image, tracing or specimen

## 2022-12-10 ENCOUNTER — Ambulatory Visit: Payer: Medicaid Other | Admitting: Obstetrics and Gynecology

## 2022-12-20 ENCOUNTER — Encounter: Payer: Medicaid Other | Admitting: Obstetrics and Gynecology

## 2022-12-28 NOTE — Progress Notes (Deleted)
[facility] New Patient Evaluation and Consultation  Referring Provider: Brock Bad, MD PCP: Patient, No Pcp Per Date of Service: 12/31/2022  SUBJECTIVE Chief Complaint: No chief complaint on file.  History of Present Illness: Tammie Byrd is a 22 y.o. Black or African-American female seen in consultation at the request of Dr. Clearance Coots for evaluation of pelvic floor relaxation.    ***Review of records significant for: ***  Urinary Symptoms: {urine leakage?:24754} Leaks *** time(s) per {days/wks/mos/yrs:310907}.  Pad use: {NUMBERS 1-10:18281} {pad option:24752} per day.   She {ACTION; IS/IS ZOX:09604540} bothered by her UI symptoms.  Day time voids ***.  Nocturia: *** times per night to void. Voiding dysfunction: she {empties:24755} her bladder well.  {DOES NOT does:27190::"does not"} use a catheter to empty bladder.  When urinating, she feels {urine symptoms:24756} Drinks: *** per day  UTIs: {NUMBERS 1-10:18281} UTI's in the last year.   {ACTIONS;DENIES/REPORTS:21021675::"Denies"} history of {urologic concerns:24757}  Pelvic Organ Prolapse Symptoms:                  She {denies/ admits to:24761} a feeling of a bulge the vaginal area. It has been present for {NUMBER 1-10:22536} {days/wks/mos/yrs:310907}.  She {denies/ admits to:24761} seeing a bulge.  This bulge {ACTION; IS/IS JWJ:19147829} bothersome.  Bowel Symptom: Bowel movements: *** time(s) per {Time; day/week/month:13537} Stool consistency: {stool consistency:24758} Straining: {yes/no:19897}.  Splinting: {yes/no:19897}.  Incomplete evacuation: {yes/no:19897}.  She {denies/ admits to:24761} accidental bowel leakage / fecal incontinence  Occurs: *** time(s) per {Time; day/week/month:13537}  Consistency with leakage: {stool consistency:24758} Bowel regimen: {bowel regimen:24759} Last colonoscopy: Date ***, Results ***  Sexual Function Sexually active: {yes/no:19897}.  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 Last pap smear was ***.  Any history of abnormal pap smears: {yes/no:19897}.   Medications: She has a current medication list which includes the following prescription(s): amoxicillin, medroxyprogesterone, megestrol, and naproxen.   Allergies: Patient has No Known Allergies.   Social History:  Social History   Tobacco Use   Smoking status: Never    Passive exposure: Never   Smokeless tobacco: Never  Vaping Use   Vaping Use: Never used  Substance Use Topics   Alcohol use: No   Drug use: No    Relationship status: {relationship status:24764} She lives with ***.   She {ACTION; IS/IS FAO:13086578} employed ***. Regular exercise: {Yes/No:304960894} History of abuse: {Yes/No:304960894}  Family History:   Family History  Problem Relation Age of Onset   Hypertension Father    Cancer Brother    Early death Brother    Cancer Maternal Grandmother    Diabetes Maternal Grandfather    Hypertension Maternal Grandfather      Review of Systems: ROS   OBJECTIVE Physical Exam: There were no vitals filed for this visit.  Physical Exam   GU / Detailed Urogynecologic Evaluation:  Pelvic Exam: Normal external female genitalia;  Bartholin's and Skene's glands normal in appearance; urethral meatus normal in appearance, no urethral masses or discharge.   CST: {gen negative/positive:315881}  Reflexes: bulbocavernosis {DESC; PRESENT/NOT PRESENT:21021351}, anocutaneous {DESC; PRESENT/NOT PRESENT:21021351} ***bilaterally.  Speculum exam reveals normal vaginal mucosa {With/Without:20273} atrophy. Cervix {exam; gyn cervix:30847}. Uterus {exam; pelvic uterus:30849}. Adnexa {exam; adnexa:12223}.    s/p hysterectomy: Speculum exam reveals normal vaginal mucosa {With/Without:20273}  atrophy and normal vaginal cuff.  Adnexa {exam; adnexa:12223}.    With apex supported, anterior compartment defect was {reduced:24765}  Pelvic floor strength {Roman # I-V:19040}/V, puborectalis {Roman # I-V:19040}/V external anal sphincter {Roman # I-V:19040}/V  Pelvic floor musculature: Right levator {Tender/Non-tender:20250}, Right obturator {Tender/Non-tender:20250}, Left levator {Tender/Non-tender:20250}, Left obturator {Tender/Non-tender:20250}  POP-Q:   POP-Q                                               Aa                                               Ba                                                 C                                                Gh                                               Pb                                               tvl                                                Ap                                               Bp                                                 D      Rectal Exam:  Normal sphincter tone, {rectocele:24766} distal rectocele, enterocoele {DESC; PRESENT/NOT PRESENT:21021351}, no rectal masses, {sign of:24767} dyssynergia when asking the patient to bear down.  Post-Void Residual (PVR) by Bladder Scan: In order  to evaluate bladder emptying, we discussed obtaining a postvoid residual and she agreed to this procedure.  Procedure: The ultrasound unit was placed on the patient's  abdomen in the suprapubic region after the patient had voided. A PVR of *** ml was obtained by bladder scan.  Laboratory Results: @ENCLABS @   ***I visualized the urine specimen, noting the specimen to be {urine color:24768}  ASSESSMENT AND PLAN Tammie Byrd is a 22 y.o. with: No diagnosis found.    Selmer Dominion, NP   Medical Decision Making:  - Reviewed/ ordered a clinical laboratory test - Reviewed/ ordered a radiologic study - Reviewed/ ordered medicine test - Decision to obtain old records - Discussion of management of or test interpretation with an external physician / other healthcare professional  - Assessment requiring independent historian - Review and summation of prior records - Independent review of image, tracing or specimen

## 2022-12-31 ENCOUNTER — Ambulatory Visit: Payer: Medicaid Other | Admitting: Obstetrics and Gynecology

## 2023-01-07 ENCOUNTER — Ambulatory Visit: Payer: Medicaid Other

## 2023-02-13 ENCOUNTER — Encounter: Payer: Medicaid Other | Admitting: Obstetrics and Gynecology

## 2023-02-14 ENCOUNTER — Ambulatory Visit: Payer: Medicaid Other | Admitting: Obstetrics and Gynecology

## 2023-02-14 DIAGNOSIS — R350 Frequency of micturition: Secondary | ICD-10-CM

## 2023-02-20 ENCOUNTER — Ambulatory Visit (HOSPITAL_COMMUNITY)
Admission: EM | Admit: 2023-02-20 | Discharge: 2023-02-20 | Disposition: A | Discharge status: Home / Self Care | Payer: Medicaid Other | Attending: Emergency Medicine | Admitting: Emergency Medicine

## 2023-02-20 ENCOUNTER — Other Ambulatory Visit: Payer: Self-pay

## 2023-02-20 ENCOUNTER — Encounter (HOSPITAL_COMMUNITY): Payer: Self-pay | Admitting: Emergency Medicine

## 2023-02-20 DIAGNOSIS — K029 Dental caries, unspecified: Secondary | ICD-10-CM | POA: Diagnosis not present

## 2023-02-20 DIAGNOSIS — K047 Periapical abscess without sinus: Secondary | ICD-10-CM | POA: Diagnosis not present

## 2023-02-20 MED ORDER — IBUPROFEN 800 MG PO TABS
ORAL_TABLET | ORAL | Status: AC
  Filled 2023-02-20: qty 1

## 2023-02-20 MED ORDER — AMOXICILLIN-POT CLAVULANATE 875-125 MG PO TABS: 1.0000 | ORAL_TABLET | Freq: Two times a day (BID) | ORAL | 0 refills | Status: DC

## 2023-02-20 MED ORDER — IBUPROFEN 800 MG PO TABS
800.0000 mg | ORAL_TABLET | Freq: Once | ORAL | Status: AC
  Administered 2023-02-20: 800 mg via ORAL

## 2023-02-20 MED ORDER — IBUPROFEN 800 MG PO TABS: 800.0000 mg | ORAL_TABLET | Freq: Three times a day (TID) | ORAL | 0 refills | Status: DC

## 2023-02-20 NOTE — Discharge Instructions (Addendum)
You appear to have a early dental abscess, most likely from your broken tooth on your lower left side.  Please take all antibiotics as prescribed until finished.  We have given you a dose of ibuprofen in clinic, you can take 800 mg every 8 hours for pain and inflammation.  It is important that you follow-up with a dentist, for further evaluation of your tooth pain, if not adequately treated by dentist, your  infection will likely keep reoccurring.  Please return to clinic for any new or concerning symptoms.  [facility] Emergency dental service [place] Address: [address] [place], [place], [place] [address] Phone: [phone]  [facility] [phone] extension [phone] [address]  Dr. Lawrence Marseilles [phone] [address]  [place] [phone] [address] [facility] [place].  [facility]cue mission [phone] extension [phone] [place], [place], [address] First come first serve for the first 10 clients.  May do simple extractions only, no wisdom teeth or surgery.  You may try the second for Thursday of the month starting at 6:30 AM.  [facility] You may call the school to see if they are still helping to provide dental care for emergent cases.

## 2023-02-20 NOTE — ED Triage Notes (Signed)
Today pain is varying, but states painful to talk and to eat.  Visible swelling to left side of face and reports tooth causing this is bottom, left .  Patient took 2 aspirin earlier per patient.

## 2023-02-20 NOTE — ED Provider Notes (Signed)
[facility]    CSN: [id number] Arrival date & time: 02/20/23  1038      History   Chief Complaint Chief Complaint  Patient presents with   Dental Pain    HPI Tammie Byrd is a 22 y.o. female.   Patient presents to clinic for left lower dental pain and swelling.  Dental pain started about 3 days ago, noticed the swelling this morning when she woke up.  She has had a known issue to this tooth, reports this tooth has been broken in half for a few years.  Does not currently have a dentist.  She denies any fevers.  She is able to eat and drink, reports anything touching this open area does cause pain.  She is unsure of her last menstrual cycle, but she is sure she is not pregnant.    The history is provided by the patient and medical records.  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 Authorizing Provider  amoxicillin-clavulanate (AUGMENTIN) 875-125 MG tablet Take 1 tablet by mouth every 12 (twelve) hours. 02/20/23  Yes Rinaldo Ratel, [place], FNP  ibuprofen (ADVIL) 800 MG tablet Take 1 tablet (800 mg total) by mouth 3 (three) times daily. 02/20/23  Yes Rinaldo Ratel, [place], FNP  amoxicillin (AMOXIL) 500 MG capsule Take 1 capsule (500 mg total) by mouth 3 (three) times daily. Patient not taking:  Reported on 09/03/2022 12/20/21   Arthor Captain, PA-C  medroxyPROGESTERone (PROVERA) 10 MG tablet Take 1 tablet (10 mg total) by mouth daily. 11/13/22   Constant, Peggy, MD  megestrol (MEGACE) 40 MG tablet Take 1 tablet (40 mg total) by mouth 2 (two) times daily. Patient not taking: Reported on 09/03/2022 10/09/21   Zadie Rhine, MD  naproxen (NAPROSYN) 375 MG tablet Take 1 tablet (375 mg total) by mouth 2 (two) times daily with a meal. Patient not taking: Reported on 09/03/2022 12/20/21   Arthor Captain, PA-C    Family History Family History  Problem Relation Age of Onset   Hypertension Father    Cancer Brother    Early death Brother    Cancer Maternal Grandmother    Diabetes Maternal Grandfather    Hypertension Maternal Grandfather     Social History Social History   Tobacco Use   Smoking status: Never    Passive exposure: Never   Smokeless tobacco: Never  Vaping Use   Vaping Use: Never used  Substance Use Topics   Alcohol use: No   Drug  use: No     Allergies   Patient has no known allergies.   Review of Systems Review of Systems  Constitutional:  Negative for fever.  HENT:  Positive for dental problem.      Physical Exam Triage Vital Signs ED Triage Vitals  Enc Vitals Group     BP 02/20/23 1137 109/69     Pulse Rate 02/20/23 1137 84     Resp 02/20/23 1137 20     Temp 02/20/23 1137 98.5 F (36.9 C)     Temp Source 02/20/23 1137 Oral     SpO2 02/20/23 1137 99 %     Weight --      Height --      Head Circumference --      Peak Flow --      Pain Score 02/20/23 1135 6     Pain Loc --      Pain Edu? --      Excl. in GC? --    No data found.  Updated Vital Signs BP 109/69 (BP Location: Right Arm) Comment: large cuff  Pulse 84   Temp 98.5 F (36.9 C) (Oral)   Resp 20   SpO2 99%   Visual Acuity Right Eye Distance:   Left Eye Distance:   Bilateral Distance:    Right Eye Near:   Left Eye Near:    Bilateral Near:     Physical Exam Vitals and  nursing note reviewed.  Constitutional:      Appearance: Normal appearance.  HENT:     Head: Normocephalic and atraumatic.     Right Ear: External ear normal.     Left Ear: External ear normal.     Nose: Nose normal.     Mouth/Throat:     Mouth: Mucous membranes are moist.     Dentition: Abnormal dentition. Dental tenderness and dental caries present.      Comments: Left lower molar broken in half.  Surrounding erythema and tenderness.  Obvious left-sided jaw swelling.  No obvious palpable mass or purulent drainage. Eyes:     Conjunctiva/sclera: Conjunctivae normal.  Cardiovascular:     Rate and Rhythm: Normal rate.  Pulmonary:     Effort: Pulmonary effort is normal. No respiratory distress.  Musculoskeletal:     Cervical back: Normal range of motion.  Skin:    General: Skin is warm and dry.  Neurological:     General: No focal deficit present.     Mental Status: She is alert and oriented to person, place, and time.  Psychiatric:        Mood and Affect: Mood normal.        Behavior: Behavior is cooperative.      UC Treatments / Results  Labs (all labs ordered are listed, but only abnormal results are displayed) Labs Reviewed - No data to display  EKG   Radiology No results found.  Procedures Procedures (including critical care time)  Medications Ordered in UC Medications  ibuprofen (ADVIL) tablet 800 mg (has no administration in time range)    Initial Impression / Assessment and Plan / UC Course  I have reviewed the triage vital signs and the nursing notes.  Pertinent labs & imaging results that were available during my care of the patient were reviewed by me and considered in my medical decision making (see chart for details).  Vitals and triage reviewed, patient is hemodynamically stable.  Left lower jaw swelling with obvious dentition issues.  No palpable abscess, no  purulent drainage.  Will cover with Augmentin for oral infection.  Given 100 mg of  ibuprofen in clinic for pain control.  Provided with dental resources, patient currently does not have a dentist.  Discussed importance of dental follow-up for further evaluation.  Plan of care, follow-up care and return precautions given, no questions at this time.     Final Clinical Impressions(s) / UC Diagnoses   Final diagnoses:  Dental infection  Dental caries     Discharge Instructions      You appear to have a early dental abscess, most likely from your broken tooth on your lower left side.  Please take all antibiotics as prescribed until finished.  We have given you a dose of ibuprofen in clinic, you can take 800 mg every 8 hours for pain and inflammation.  It is important that you follow-up with a dentist, for further evaluation of your tooth pain, if not adequately treated by dentist, your  infection will likely keep reoccurring.  Please return to clinic for any new or concerning symptoms.  [facility] Emergency dental service in [place], [place] Address: [address] [place], [place], [place] [address] Phone: [phone]  [facility] [phone] extension [phone] [address]  Dr. Lawrence Marseilles [phone] [address].  [employer] [phone] [address] [facility] [place].  [facility]cue mission [phone] extension [phone] [place], [place], [address] First come first serve for the first 10 clients.  May do simple extractions only, no wisdom teeth or surgery.  You may try the second for Thursday of the month starting at 6:30 AM.  [facility] You may call the school to see if they are still helping to provide dental care for emergent cases.      ED Prescriptions     Medication Sig Dispense Auth. Provider   amoxicillin-clavulanate (AUGMENTIN) 875-125 MG tablet Take 1 tablet by mouth every 12 (twelve) hours. 14 tablet Rinaldo Ratel, [place], [place]   ibuprofen (ADVIL) 800 MG tablet Take 1 tablet (800 mg total) by mouth 3 (three) times  daily. 21 tablet Loleta Frommelt, [place], [place]      PDMP not reviewed this encounter.   Jeramie Scogin, [place] N, [place] 02/20/23 1151

## 2023-03-15 ENCOUNTER — Ambulatory Visit (HOSPITAL_COMMUNITY): Payer: Medicaid Other

## 2023-03-18 ENCOUNTER — Encounter (HOSPITAL_COMMUNITY): Payer: Self-pay

## 2023-03-18 ENCOUNTER — Ambulatory Visit (HOSPITAL_COMMUNITY)
Admission: EM | Admit: 2023-03-18 | Discharge: 2023-03-18 | Disposition: A | Discharge status: Home / Self Care | Payer: Medicaid Other | Attending: Emergency Medicine | Admitting: Emergency Medicine

## 2023-03-18 DIAGNOSIS — Z113 Encounter for screening for infections with a predominantly sexual mode of transmission: Secondary | ICD-10-CM | POA: Insufficient documentation

## 2023-03-18 DIAGNOSIS — Z202 Contact with and (suspected) exposure to infections with a predominantly sexual mode of transmission: Secondary | ICD-10-CM | POA: Insufficient documentation

## 2023-03-18 LAB — POCT URINE PREGNANCY: Preg Test, Ur: NEGATIVE

## 2023-03-18 MED ORDER — DOXYCYCLINE HYCLATE 100 MG PO CAPS: 100.0000 mg | ORAL_CAPSULE | Freq: Two times a day (BID) | ORAL | 2 refills | Status: AC

## 2023-03-18 NOTE — ED Provider Notes (Signed)
[facility]    CSN: [id number] Arrival date & time: 03/18/23  1843    HISTORY  No chief complaint on file.  HPI Tammie Byrd is a pleasant, 22 y.o. female who presents to urgent care today. Patient reports that her sexual partner informed her that he tested positive for chlamydia. Patient states that she is having clear and white vaginal discharge x 7 days. Patient denies burning with urination, increased frequency of urination, suprapubic pain, perineal pain, flank pain, fever, chills, malaise, rigors, significant fatigue, and genital lesion(s).     The history is provided by the patient.   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 Authorizing Provider  doxycycline (VIBRAMYCIN) 100 MG capsule Take 1 capsule (100 mg total) by mouth 2 (two) times daily. 03/18/23 06/16/23 Yes Theadora Rama Scales, PA-C    Family History Family History  Problem Relation Age of Onset   Hypertension Father    Cancer Brother    Early death Brother    Cancer Maternal Grandmother    Diabetes Maternal Grandfather    Hypertension Maternal Grandfather    Social History Social History   Tobacco Use   Smoking status: Never    Passive exposure: Never   Smokeless tobacco: Never  Vaping Use   Vaping status: Never Used  Substance Use Topics   Alcohol use: No   Drug  use: No   Allergies   Patient has no known allergies.  Review of Systems Review of Systems Pertinent findings revealed after performing a 14 point review of systems has been noted in the history of present illness.  Physical Exam Vital Signs BP 114/76 (BP Location: Right Arm)   Pulse 88   Temp 98.4 F (36.9 C) (Oral)   Resp 14   LMP 02/20/2023 (Approximate)   SpO2 97%   No data found.  Physical Exam Vitals and nursing note reviewed.  Constitutional:      General: She is not in acute distress.    Appearance: Normal appearance. She is not ill-appearing.  HENT:     Head: Normocephalic and atraumatic.  Eyes:     General: Lids are normal.        Right eye: No discharge.        Left eye: No discharge.     Extraocular Movements: Extraocular movements intact.     Conjunctiva/sclera: Conjunctivae normal.     Right eye: Right conjunctiva is not injected.     Left eye: Left conjunctiva is not injected.  Neck:     Trachea: Trachea and phonation normal.  Cardiovascular:     Rate and Rhythm: Normal rate and regular rhythm.     Pulses: Normal pulses.     Heart sounds: Normal heart sounds. No murmur heard.    No friction rub. No gallop.  Pulmonary:     Effort: Pulmonary effort is normal. No accessory muscle usage, prolonged expiration or respiratory distress.     Breath sounds: Normal breath sounds. No stridor, decreased air movement or transmitted upper airway sounds. No decreased breath sounds, wheezing, rhonchi or rales.  Chest:     Chest wall: No tenderness.  Genitourinary:    Comments: Patient politely declines pelvic exam today, patient provided a vaginal swab for testing. Musculoskeletal:        General: Normal range of motion.     Cervical back: Normal range of motion and neck supple. Normal range of motion.  Lymphadenopathy:     Cervical: No cervical adenopathy.  Skin:    General: Skin is warm and dry.     Findings: No erythema or rash.  Neurological:     General:  No focal deficit present.     Mental Status: She is alert and oriented to person, place, and time.  Psychiatric:        Mood and Affect: Mood normal.        Behavior: Behavior normal.     Visual Acuity Right Eye Distance:   Left Eye Distance:   Bilateral Distance:    Right Eye Near:   Left Eye Near:    Bilateral Near:     UC Couse / Diagnostics / Procedures:     Radiology No results found.  Procedures Procedures (including critical care time) EKG  Pending results:  Labs Reviewed  CERVICOVAGINAL ANCILLARY ONLY - Abnormal; Notable for the following components:      Result Value   Candida Vaginitis Positive (*)    All other components within normal limits  POCT URINE PREGNANCY    Medications Ordered in UC: Medications - No data to display  UC Diagnoses / Final Clinical Impressions(s)   I have reviewed the triage vital signs and the nursing notes.  Pertinent labs & imaging results that were available during my care of the patient were reviewed by me and considered in my medical decision making (see chart for details).    Final diagnoses:  Exposure to chlamydia  Screening examination for STD (sexually transmitted disease)   Patient was provided with Doxycycline 100 mg twice daily for 7 days for empiric treatment of presumed chlamydia based on the history provided to me today. Patient was advised to abstain from sexual intercourse for the next 7 days while being treated.  Patient was also advised to use condoms to protect themselves from STD exposure. STD screening was performed, patient advised that the results be posted to their MyChart and if any of the results are positive, they will be notified by phone, further treatment will be provided as indicated based on results of STD screening. Urine pregnancy test was negative. Return precautions advised.  Drug allergies reviewed, all questions addressed.   Please see discharge instructions below for details of plan of  care as provided to patient. ED Prescriptions     Medication Sig Dispense Auth. Provider   doxycycline (VIBRAMYCIN) 100 MG capsule Take 1 capsule (100 mg total) by mouth 2 (two) times daily. 60 capsule Theadora Rama Scales, PA-C      PDMP not reviewed this encounter.  Disposition Upon Discharge:  Condition: stable for discharge home  Patient presented with concern for an acute illness with associated systemic symptoms and significant discomfort requiring urgent management. In my opinion, this is a condition that a prudent lay person (someone who possesses an average knowledge of health and medicine) may potentially expect to result in complications if not addressed urgently such as respiratory distress, impairment of bodily function or dysfunction of bodily organs.   As such, the patient has been evaluated and assessed, work-up was performed and treatment was provided in alignment with urgent care protocols and evidence based medicine.  Patient/parent/caregiver has been advised that the patient may require follow up for further testing and/or treatment if the symptoms continue in spite of treatment, as clinically indicated and appropriate.  Routine symptom specific, illness specific and/or disease specific instructions were discussed with the patient and/or caregiver at length.  Prevention strategies for avoiding STD exposure were also discussed.  The patient will follow up with their current PCP if and as advised. If the patient does not currently have a PCP we will assist them in obtaining one.   The patient may need specialty follow up if the symptoms continue, in spite of conservative treatment and management, for further workup, evaluation, consultation and treatment as clinically indicated and appropriate.  Patient/parent/caregiver verbalized understanding and agreement of plan as discussed.  All questions were addressed during visit.  Please see discharge instructions below for  further details of plan.  Discharge Instructions:   Discharge Instructions      Based on the symptoms and concerns you shared with me today, you were treated for presumed chlamydia with a prescription for doxycycline, 1 tablet twice daily for the next 7 days.  Please take all tablets as prescribed, do not skip doses.  Failure to take all doses as prescribed can result in a worsening infection that will be more difficult to treat and resolve.  Please abstain from sexual intercourse of any kind, vaginal, oral or anal, for 7 days.   Your urine pregnancy test today is negative.   The results of your vaginal swab test which tests for BV, yeast, gonorrhea, chlamydia and trichomonas will be posted to your MyChart account in the next 3 to 5 days.  If any of your results are abnormal, you will receive a phone call regarding further treatment.  Additional prescriptions, if any are needed, will be provided for you at your pharmacy.   Please abstain from sexual intercourse of any kind, vaginal, oral or anal, until until you have received the results of your test.   If you have not had complete resolution of your symptoms after completing any recommended treatment or if your symptoms worsen, please return for repeat evaluation.   Thank you for visiting urgent care today.  I appreciate the opportunity to participate in your care.     This office note has been dictated using Teaching laboratory technician.  Unfortunately, this method of dictation can sometimes lead to typographical or grammatical errors.  I apologize for your inconvenience in advance if this occurs.  Please do not hesitate to reach out to me if clarification is needed.       Theadora Rama Scales, [place] 03/20/23 [phone]

## 2023-03-18 NOTE — Discharge Instructions (Signed)
Based on the symptoms and concerns you shared with me today, you were treated for presumed chlamydia with a prescription for doxycycline, 1 tablet twice daily for the next 7 days.  Please take all tablets as prescribed, do not skip doses.  Failure to take all doses as prescribed can result in a worsening infection that will be more difficult to treat and resolve.  Please abstain from sexual intercourse of any kind, vaginal, oral or anal, for 7 days.   Your urine pregnancy test today is negative.   The results of your vaginal swab test which tests for BV, yeast, gonorrhea, chlamydia and trichomonas will be posted to your MyChart account in the next 3 to 5 days.  If any of your results are abnormal, you will receive a phone call regarding further treatment.  Additional prescriptions, if any are needed, will be provided for you at your pharmacy.   Please abstain from sexual intercourse of any kind, vaginal, oral or anal, until until you have received the results of your test.   If you have not had complete resolution of your symptoms after completing any recommended treatment or if your symptoms worsen, please return for repeat evaluation.   Thank you for visiting urgent care today.  I appreciate the opportunity to participate in your care.
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
## 2023-03-20 ENCOUNTER — Telehealth: Payer: Self-pay | Admitting: Emergency Medicine

## 2023-03-20 DIAGNOSIS — B3731 Acute candidiasis of vulva and vagina: Secondary | ICD-10-CM

## 2023-03-20 LAB — CERVICOVAGINAL ANCILLARY ONLY
Bacterial Vaginitis (gardnerella): NEGATIVE
Candida Glabrata: NEGATIVE
Candida Vaginitis: POSITIVE — AB
Chlamydia: NEGATIVE
Comment: NEGATIVE
Comment: NEGATIVE
Comment: NEGATIVE
Comment: NEGATIVE
Comment: NEGATIVE
Comment: NORMAL
Neisseria Gonorrhea: NEGATIVE
Trichomonas: NEGATIVE

## 2023-03-20 MED ORDER — FLUCONAZOLE 150 MG PO TABS: ORAL_TABLET | ORAL | 0 refills | Status: DC

## 2023-03-20 NOTE — Telephone Encounter (Signed)
Pt tested positive for vaginal candida, rx for diflucan sent to pt's pharmacy.

## 2023-03-21 ENCOUNTER — Ambulatory Visit: Payer: Medicaid Other

## 2023-05-09 ENCOUNTER — Ambulatory Visit: Payer: Medicaid Other

## 2023-06-12 ENCOUNTER — Emergency Department (HOSPITAL_COMMUNITY): Payer: Medicaid Other

## 2023-06-12 ENCOUNTER — Emergency Department (HOSPITAL_COMMUNITY)
Admission: EM | Admit: 2023-06-12 | Discharge: 2023-06-12 | Disposition: A | Discharge status: Home / Self Care | Payer: Medicaid Other | Attending: Emergency Medicine | Admitting: Emergency Medicine

## 2023-06-12 ENCOUNTER — Other Ambulatory Visit: Payer: Self-pay

## 2023-06-12 ENCOUNTER — Encounter (HOSPITAL_COMMUNITY): Payer: Self-pay

## 2023-06-12 DIAGNOSIS — Y92410 Unspecified street and highway as the place of occurrence of the external cause: Secondary | ICD-10-CM | POA: Insufficient documentation

## 2023-06-12 DIAGNOSIS — E876 Hypokalemia: Secondary | ICD-10-CM

## 2023-06-12 DIAGNOSIS — F10920 Alcohol use, unspecified with intoxication, uncomplicated: Secondary | ICD-10-CM

## 2023-06-12 DIAGNOSIS — Y908 Blood alcohol level of 240 mg/100 ml or more: Secondary | ICD-10-CM | POA: Diagnosis not present

## 2023-06-12 DIAGNOSIS — R4182 Altered mental status, unspecified: Secondary | ICD-10-CM | POA: Diagnosis present

## 2023-06-12 LAB — CBC
HCT: 40.5 % (ref 36.0–46.0)
Hemoglobin: 12.6 g/dL (ref 12.0–15.0)
MCH: 27.6 pg (ref 26.0–34.0)
MCHC: 31.1 g/dL (ref 30.0–36.0)
MCV: 88.8 fL (ref 80.0–100.0)
Platelets: 351 10*3/uL (ref 150–400)
RBC: 4.56 MIL/uL (ref 3.87–5.11)
RDW: 14.7 % (ref 11.5–15.5)
WBC: 9.4 10*3/uL (ref 4.0–10.5)
nRBC: 0 % (ref 0.0–0.2)

## 2023-06-12 LAB — HCG, SERUM, QUALITATIVE: Preg, Serum: NEGATIVE

## 2023-06-12 LAB — COMPREHENSIVE METABOLIC PANEL
ALT: 19 U/L (ref 0–44)
AST: 23 U/L (ref 15–41)
Albumin: 3.9 g/dL (ref 3.5–5.0)
Alkaline Phosphatase: 57 U/L (ref 38–126)
Anion gap: 15 (ref 5–15)
BUN: 10 mg/dL (ref 6–20)
CO2: 21 mmol/L — ABNORMAL LOW (ref 22–32)
Calcium: 9.2 mg/dL (ref 8.9–10.3)
Chloride: 104 mmol/L (ref 98–111)
Creatinine, Ser: 0.76 mg/dL (ref 0.44–1.00)
GFR, Estimated: >60 mL/min (ref >60)
Glucose, Bld: 129 mg/dL — ABNORMAL HIGH (ref 70–99)
Potassium: 2.9 mmol/L — ABNORMAL LOW (ref 3.5–5.1)
Sodium: 140 mmol/L (ref 135–145)
Total Bilirubin: 0.4 mg/dL (ref 0.3–1.2)
Total Protein: 8.3 g/dL — ABNORMAL HIGH (ref 6.5–8.1)

## 2023-06-12 LAB — CBG MONITORING, ED: Glucose-Capillary: 142 mg/dL — ABNORMAL HIGH (ref 70–99)

## 2023-06-12 LAB — ETHANOL: Alcohol, Ethyl (B): 272 mg/dL — ABNORMAL HIGH (ref <10)

## 2023-06-12 MED ORDER — POTASSIUM CHLORIDE CRYS ER 20 MEQ PO TBCR: 20.0000 meq | EXTENDED_RELEASE_TABLET | Freq: Two times a day (BID) | ORAL | 0 refills | Status: DC

## 2023-06-12 MED ORDER — POTASSIUM CHLORIDE CRYS ER 20 MEQ PO TBCR
40.0000 meq | EXTENDED_RELEASE_TABLET | Freq: Once | ORAL | Status: AC
  Administered 2023-06-12: 40 meq via ORAL
  Filled 2023-06-12: qty 2
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
## 2023-09-09 ENCOUNTER — Ambulatory Visit (HOSPITAL_COMMUNITY)
Admission: EM | Admit: 2023-09-09 | Discharge: 2023-09-09 | Disposition: A | Discharge status: Home / Self Care | Payer: Medicaid Other | Attending: Emergency Medicine | Admitting: Emergency Medicine

## 2023-09-09 DIAGNOSIS — K0889 Other specified disorders of teeth and supporting structures: Secondary | ICD-10-CM

## 2023-09-09 MED ORDER — KETOROLAC TROMETHAMINE 30 MG/ML IJ SOLN: 30.0000 mg | Freq: Once | INTRAMUSCULAR | Status: DC

## 2023-09-09 MED ORDER — IBUPROFEN 800 MG PO TABS: 800.0000 mg | ORAL_TABLET | Freq: Three times a day (TID) | ORAL | 0 refills | Status: DC

## 2023-09-09 MED ORDER — KETOROLAC TROMETHAMINE 30 MG/ML IJ SOLN
INTRAMUSCULAR | Status: AC
  Filled 2023-09-09: qty 1

## 2023-09-09 NOTE — ED Triage Notes (Signed)
 Pt presents with recurrent dental pain. Pt had one tooth pulled last yr in the summer and states she does not have a dentist.   Pt is requesting abx for possible infection.

## 2023-09-09 NOTE — ED Provider Notes (Signed)
 [facility]    CSN: [id number] Arrival date & time: 09/09/23  1124      History   Chief Complaint Chief Complaint  Patient presents with   Dental Pain    HPI Tammie Byrd is a 23 y.o. female.   Patient presents with left lower dental pain that began this morning.  Patient has history of dental abscess and is worried she has another.   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 Authorizing Provider  ibuprofen  (ADVIL ) 800 MG tablet Take 1 tablet (800 mg total) by mouth 3 (three) times daily. 09/09/23  Yes Kahner Yanik A, NP  fluconazole  (DIFLUCAN ) 150 MG tablet Take 1 tablet today.  Take second tablet 3 days later. 03/20/23   Joesph Shaver Scales, PA-C  potassium chloride  SA (KLOR-CON  M) 20 MEQ tablet Take 1 tablet (20 mEq total) by mouth 2 (two) times daily for 4 days. 06/12/23 06/16/23  Roemhildt, Lorin T, PA-C    Family History Family History  Problem Relation Age of Onset   Hypertension Father    Cancer Brother    Early death Brother    Cancer Maternal Grandmother    Diabetes Maternal Grandfather    Hypertension Maternal Grandfather     Social History Social History   Tobacco Use   Smoking status: Never    Passive exposure: Never   Smokeless  tobacco: Never  Vaping Use   Vaping status: Never Used  Substance Use Topics   Alcohol use: No   Drug use: No     Allergies   Patient has no known allergies.   Review of Systems Review of Systems  Constitutional:  Negative for chills, fatigue and fever.  HENT:  Positive for dental problem. Negative for facial swelling.      Physical Exam Triage Vital Signs ED Triage Vitals  Encounter Vitals Group     BP 09/09/23 1239 129/88     Systolic BP Percentile --      Diastolic BP Percentile --      Pulse Rate 09/09/23 1239 91     Resp 09/09/23 1239 17     Temp 09/09/23 1239 98.1 F (36.7 C)     Temp Source 09/09/23 1239 Oral     SpO2 09/09/23 1239 95 %     Weight --      Height --      Head Circumference --      Peak Flow --  Pain Score 09/09/23 1238 8     Pain Loc --      Pain Education --      Exclude from Growth Chart --    No data found.  Updated Vital Signs BP 129/88 (BP Location: Left Arm)   Pulse 91   Temp 98.1 F (36.7 C) (Oral)   Resp 17   LMP  (LMP Unknown)   SpO2 95%   Breastfeeding No   Visual Acuity Right Eye Distance:   Left Eye Distance:   Bilateral Distance:    Right Eye Near:   Left Eye Near:    Bilateral Near:     Physical Exam Vitals and nursing note reviewed.  Constitutional:      General: She is awake. She is not in acute distress.    Appearance: Normal appearance. She is well-developed and well-groomed.  HENT:     Mouth/Throat:     Dentition: Abnormal dentition. Dental caries present. No dental tenderness, gingival swelling, dental abscesses or gum lesions.      Comments: Bottom left molar appears to have cavity. Neurological:     Mental Status: She is alert.  Psychiatric:        Behavior: Behavior is cooperative.      UC Treatments / Results  Labs (all labs ordered are listed, but only abnormal results are displayed) Labs Reviewed - No data to display  EKG   Radiology No results  found.  Procedures Procedures (including critical care time)  Medications Ordered in UC Medications  ketorolac  (TORADOL ) 30 MG/ML injection 30 mg (has no administration in time range)    Initial Impression / Assessment and Plan / UC Course  I have reviewed the triage vital signs and the nursing notes.  Pertinent labs & imaging results that were available during my care of the patient were reviewed by me and considered in my medical decision making (see chart for details).     Patient presented with left lower dental pain that began this morning.  History of dental abscess.  Upon assessment patient is bottom left molar appears to have a cavity.  Without obvious swelling redness or signs of abscess.  Given Toradol  injection in clinic.  Prescribed ibuprofen  for pain.  Given dental resources.  Follow-up and return precautions discussed. Final Clinical Impressions(s) / UC Diagnoses   Final diagnoses:  Pain, dental     Discharge Instructions      You can alternate between 800mg  Ibuprofen  and Tylenol  as needed for pain. It appears you have a cavity at this time so please use the dental resources provided to find a dentist to fix this sooner than later to avoid continued pain. Return here as needed.     ED Prescriptions     Medication Sig Dispense Auth. Provider   ibuprofen  (ADVIL ) 800 MG tablet Take 1 tablet (800 mg total) by mouth 3 (three) times daily. 21 tablet [name], NP      PDMP not reviewed this encounter.   [name], NP 09/09/23 1304

## 2023-09-09 NOTE — Discharge Instructions (Signed)
 You can alternate between 800mg  Ibuprofen and Tylenol as needed for pain. It appears you have a cavity at this time so please use the dental resources provided to find a dentist to fix this sooner than later to avoid continued pain. Return here as needed.

## 2023-09-14 ENCOUNTER — Encounter (HOSPITAL_COMMUNITY): Payer: Self-pay

## 2023-09-14 ENCOUNTER — Ambulatory Visit (HOSPITAL_COMMUNITY)
Admission: EM | Admit: 2023-09-14 | Discharge: 2023-09-14 | Disposition: A | Discharge status: Home / Self Care | Payer: Medicaid Other | Attending: Internal Medicine | Admitting: Internal Medicine

## 2023-09-14 DIAGNOSIS — K0889 Other specified disorders of teeth and supporting structures: Secondary | ICD-10-CM | POA: Diagnosis not present

## 2023-09-14 DIAGNOSIS — K047 Periapical abscess without sinus: Secondary | ICD-10-CM | POA: Diagnosis not present

## 2023-09-14 MED ORDER — HYDROCODONE-ACETAMINOPHEN 5-325 MG PO TABS: 1.0000 | ORAL_TABLET | Freq: Four times a day (QID) | ORAL | 0 refills | Status: DC | PRN

## 2023-09-14 MED ORDER — CEFTRIAXONE SODIUM 500 MG IJ SOLR
500.0000 mg | INTRAMUSCULAR | Status: DC
  Administered 2023-09-14: 500 mg via INTRAMUSCULAR

## 2023-09-14 MED ORDER — LIDOCAINE HCL (PF) 1 % IJ SOLN
INTRAMUSCULAR | Status: AC
  Filled 2023-09-14: qty 2

## 2023-09-14 MED ORDER — CEFTRIAXONE SODIUM 500 MG IJ SOLR
INTRAMUSCULAR | Status: AC
  Filled 2023-09-14: qty 500

## 2023-09-14 MED ORDER — KETOROLAC TROMETHAMINE 30 MG/ML IJ SOLN
INTRAMUSCULAR | Status: AC
  Filled 2023-09-14: qty 1

## 2023-09-14 MED ORDER — AMOXICILLIN-POT CLAVULANATE 875-125 MG PO TABS: 1.0000 | ORAL_TABLET | Freq: Two times a day (BID) | ORAL | 0 refills | Status: AC

## 2023-09-14 MED ORDER — KETOROLAC TROMETHAMINE 30 MG/ML IJ SOLN
30.0000 mg | Freq: Once | INTRAMUSCULAR | Status: AC
  Administered 2023-09-14: 30 mg via INTRAMUSCULAR

## 2023-09-14 NOTE — Discharge Instructions (Addendum)
Dental abscess with lymph node swelling and facial swelling. We will treat with the following:  Rocephin injection given today. This is an antibiotic  Toradol injection given today. This is a medication to help with pain. This is not a narcotic.  Augmentin 875 mg twice daily for 10 days. This is an antibiotic. Start this medication 1/19 Hydrocodone 5 mg 1-2 tablets every 6 hours as needed for pain Keep the appointment with the dentist.  Return to urgent care or PCP if symptoms worsen or fail to resolve.

## 2023-09-14 NOTE — ED Provider Notes (Signed)
[facility]    CSN: [id number] Arrival date & time: 09/14/23  1630      History   Chief Complaint Chief Complaint  Patient presents with   Dental Pain    HPI Tammie Byrd is a 23 y.o. female.   23 year old female who presents to urgent care with complaints of dental abscess with left facial swelling left neck swelling and severe pain.  She was seen on January 13 for similar symptoms.  She was advised at that time to follow-up with dental.  She was not given any antibiotics at that time.  She has an appointment scheduled for Tuesday however she reports that her pain is severe and she is developing more significant facial swelling from the abscess.  She reports that she was offered a Toradol shot at her last appointment and would like to do that now.  She has been feeling feverish as well.   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 Authorizing Provider  amoxicillin-clavulanate (AUGMENTIN) 875-125 MG tablet Take 1 tablet by mouth every 12 (twelve) hours for 10 days. 09/14/23 09/24/23 Yes Montgomery Favor A, PA-C  HYDROcodone-acetaminophen (NORCO/VICODIN) 5-325 MG tablet Take 1-2 tablets by mouth every 6 (six) hours as needed for severe pain (pain score 7-10).  09/14/23  Yes Gerline Ratto A, PA-C  ibuprofen (ADVIL) 800 MG tablet Take 1 tablet (800 mg total) by mouth 3 (three) times daily. 09/09/23  Yes Letta Kocher, NP    Family History Family History  Problem Relation Age of Onset   Hypertension Father    Cancer Brother    Early death Brother    Cancer Maternal Grandmother    Diabetes Maternal Grandfather    Hypertension Maternal Grandfather     Social History Social History   Tobacco Use   Smoking status: Never    Passive exposure: Never   Smokeless tobacco: Never  Vaping Use   Vaping status: Never Used  Substance Use Topics   Alcohol use: No   Drug use: No     Allergies   Patient has no known allergies.   Review of Systems Review of Systems  Constitutional:  Negative for chills and fever.  HENT:  Positive for dental problem and facial swelling. Negative for ear pain and sore throat.        Jaw pain and swelling  Eyes:  Negative for pain and visual disturbance.  Respiratory:  Negative for cough and  shortness of breath.   Cardiovascular:  Negative for chest pain and palpitations.  Gastrointestinal:  Negative for abdominal pain and vomiting.  Genitourinary:  Negative for dysuria and hematuria.  Musculoskeletal:  Negative for arthralgias and back pain.  Skin:  Negative for color change and rash.  Neurological:  Negative for seizures and syncope.  All other systems reviewed and are negative.    Physical Exam Triage Vital Signs ED Triage Vitals  Encounter Vitals Group     BP 09/14/23 1712 136/86     Systolic BP Percentile --      Diastolic BP Percentile --      Pulse Rate 09/14/23 1712 79     Resp 09/14/23 1712 16     Temp 09/14/23 1712 99 F (37.2 C)     Temp Source 09/14/23 1712 Oral     SpO2 09/14/23 1712 98 %     Weight 09/14/23 1712 200 lb (90.7 kg)     Height 09/14/23 1712 5\' 5"  (1.651 m)     Head Circumference --      Peak Flow --      Pain Score 09/14/23 1711 10     Pain Loc --      Pain  Education --      Exclude from Growth Chart --    No data found.  Updated Vital Signs BP 136/86 (BP Location: Right Arm)   Pulse 79   Temp 99 F (37.2 C) (Oral)   Resp 16   Ht 5\' 5"  (1.651 m)   Wt 200 lb (90.7 kg)   LMP 08/11/2023 (Approximate)   SpO2 98%   BMI 33.28 kg/m   Visual Acuity Right Eye Distance:   Left Eye Distance:   Bilateral Distance:    Right Eye Near:   Left Eye Near:    Bilateral Near:     Physical Exam Vitals and nursing note reviewed.  Constitutional:      General: She is not in acute distress.    Appearance: She is well-developed.  HENT:     Head: Normocephalic and atraumatic.      Mouth/Throat:     Mouth: Mucous membranes are moist.   Eyes:     Conjunctiva/sclera: Conjunctivae normal.  Cardiovascular:     Rate and Rhythm: Normal rate and regular rhythm.     Heart sounds: No murmur heard. Pulmonary:     Effort: Pulmonary effort is normal. No respiratory distress.     Breath sounds: Normal breath sounds.  Abdominal:     Palpations: Abdomen is soft.     Tenderness: There is no abdominal tenderness.  Musculoskeletal:        General: No swelling.     Cervical back: Neck supple.  Skin:    General: Skin is warm and dry.     Capillary Refill: Capillary refill takes less than 2 seconds.  Neurological:     Mental Status: She is alert.  Psychiatric:        Mood and Affect: Mood normal.      UC Treatments / Results  Labs (all labs ordered are listed, but only abnormal results are displayed) Labs Reviewed - No data to display  EKG   Radiology No results found.  Procedures Procedures (including critical care time)  Medications Ordered in UC Medications  cefTRIAXone (ROCEPHIN) injection 500 mg (has no administration in time range)  ketorolac (TORADOL) 30 MG/ML injection 30 mg (has no administration in time range)    Initial Impression / Assessment  and Plan / UC Course  I have reviewed the triage vital signs and the nursing  notes.  Pertinent labs & imaging results that were available during my care of the patient were reviewed by me and considered in my medical decision making (see chart for details).     Dental abscess  Pain, dental   Dental abscess with lymph node swelling and facial swelling. We will treat with the following:  Rocephin injection given today. This is an antibiotic  Toradol injection given today. This is a medication to help with pain. This is not a narcotic.  Augmentin 875 mg twice daily for 10 days. This is an antibiotic. Start this medication 1/19 Hydrocodone 5 mg 1-2 tablets every 6 hours as needed for pain Keep the appointment with the dentist.  Return to urgent care or PCP if symptoms worsen or fail to resolve.    Final Clinical Impressions(s) / UC Diagnoses   Final diagnoses:  Dental abscess  Pain, dental     Discharge Instructions      Dental abscess with lymph node swelling and facial swelling. We will treat with the following:  Rocephin injection given today. This is an antibiotic  Toradol injection given today. This is a medication to help with pain. This is not a narcotic.  Augmentin 875 mg twice daily for 10 days. This is an antibiotic. Start this medication 1/19 Hydrocodone 5 mg 1-2 tablets every 6 hours as needed for pain Keep the appointment with the dentist.  Return to urgent care or PCP if symptoms worsen or fail to resolve.     ED Prescriptions     Medication Sig Dispense Auth. Provider   amoxicillin-clavulanate (AUGMENTIN) 875-125 MG tablet Take 1 tablet by mouth every 12 (twelve) hours for 10 days. 20 tablet Doristine Mango A, PA-C   HYDROcodone-acetaminophen (NORCO/VICODIN) 5-325 MG tablet Take 1-2 tablets by mouth every 6 (six) hours as needed for severe pain (pain score 7-10). 10 tablet Landis Martins, [place]      I have reviewed the PDMP during this encounter.   Landis Martins, [place] 09/14/23 1741

## 2023-09-14 NOTE — ED Triage Notes (Signed)
DENTAL PAIN- was seen on 01/15. Declined shot for pain. She would like to get injection today. Ongoing for a while. States the pain is 10/10 with facial swelling.

## 2023-11-12 ENCOUNTER — Ambulatory Visit: Admitting: Obstetrics and Gynecology

## 2023-11-13 ENCOUNTER — Encounter: Payer: Self-pay | Admitting: Obstetrics and Gynecology

## 2023-11-13 ENCOUNTER — Other Ambulatory Visit (HOSPITAL_COMMUNITY)
Admission: RE | Admit: 2023-11-13 | Discharge: 2023-11-13 | Disposition: A | Discharge status: Home / Self Care | Source: Ambulatory Visit | Attending: Obstetrics and Gynecology | Admitting: Obstetrics and Gynecology

## 2023-11-13 ENCOUNTER — Ambulatory Visit (INDEPENDENT_AMBULATORY_CARE_PROVIDER_SITE_OTHER): Admitting: Obstetrics and Gynecology

## 2023-11-13 VITALS — BP 131/74 | HR 90 | Ht 66.0 in | Wt 220.0 lb

## 2023-11-13 DIAGNOSIS — N939 Abnormal uterine and vaginal bleeding, unspecified: Secondary | ICD-10-CM | POA: Diagnosis present

## 2023-11-13 DIAGNOSIS — R87612 Low grade squamous intraepithelial lesion on cytologic smear of cervix (LGSIL): Secondary | ICD-10-CM

## 2023-11-13 DIAGNOSIS — Z124 Encounter for screening for malignant neoplasm of cervix: Secondary | ICD-10-CM | POA: Diagnosis not present

## 2023-11-13 DIAGNOSIS — Z3202 Encounter for pregnancy test, result negative: Secondary | ICD-10-CM | POA: Diagnosis not present

## 2023-11-13 MED ORDER — MEDROXYPROGESTERONE ACETATE 10 MG PO TABS
10.0000 mg | ORAL_TABLET | Freq: Every day | ORAL | 0 refills | Status: DC
Start: 2023-11-13 — End: 2023-11-26

## 2023-11-13 NOTE — Progress Notes (Unsigned)
   GYNECOLOGY PROGRESS NOTE  History:  23 y.o. G2P1101 presents to [facility] for heavy bleeding. Has always had irregular bleeding, has skipped periods before.  Didn't have period for three months. Now has had persistent bleeding since 2/15. Reports daily bleeding, blood clots, not soaking through every one hour. Irregular periods started 2020, sometimes two in a month, or will skip a month.    The following portions of the patient's history were reviewed and updated as appropriate: allergies, current medications, past family history, past medical history, past social history, past surgical history and problem list. Last pap smear on 10/02/22, LSIL, did not come for colpo   Health Maintenance Due  Topic Date Due   HPV VACCINES (1 - 3-dose series) Never done   DTaP/Tdap/Td (1 - Tdap) Never done   INFLUENZA VACCINE  Never done   COVID-19 Vaccine (1 - 2024-25 season) Never done     Review of Systems:  Pertinent items are noted in HPI.   Objective:  Physical Exam Blood pressure 131/74, pulse 90, height 5\' 6"  (1.676 m), weight 220 lb (99.8 kg). VS reviewed, nursing note reviewed,  Constitutional: well developed, well nourished, no distress HEENT: normocephalic CV: normal rate Pulm/chest wall: normal effort Breast Exam: deferred Abdomen: soft Neuro: alert and oriented  Skin: warm, dry Psych: affect normal Pelvic exam:Pelvic: normal appearing vulva with no masses, tenderness or lesions  VAGINA: normal appearing vagina with normal color and discharge, no lesions  CERVIX: normal appearing cervix without discharge or lesions, no CMT  Thin prep pap is done   UTERUS: uterus is felt to be normal size, shape, consistency and nontender   ADNEXA: No adnexal masses or tenderness noted.  Extremities:  No swelling or varicosities noted   Assessment & Plan:  1. Abnormal uterine bleeding (AUB) (Primary) Discussed lab work up given hx of irregular cycles.  Does not desire bc at this time, will  trial provera  - medroxyPROGESTERone (PROVERA) 10 MG tablet; Take 1 tablet (10 mg total) by mouth daily.  Dispense: 10 tablet; Refill: 0 - RPR+HBsAg+HCVAb+...; Future - TSH Rfx on Abnormal to Free T4; Future - Prolactin; Future - FSH/LH; Future - Testosterone,Free and Total; Future - HgB A1c; Future - POCT urine pregnancy  2. Cervical cancer screening 3. LGSIL on Pap smear of cervix Did not have colpo, repeat today to determine follow up   - Cytology - PAP( [facility])  Return in one month for follow up   Albertine Grates, FNP

## 2023-11-13 NOTE — Progress Notes (Signed)
 Pt presents for prolonged bleeding. Pt reports bleeding for since 10/13/23. No periods for 3 months before this episode.   Abnormal PAP last year, did not have colpo.

## 2023-11-18 LAB — POCT URINE PREGNANCY: Preg Test, Ur: NEGATIVE

## 2023-11-19 LAB — CYTOLOGY - PAP
Chlamydia: NEGATIVE
Comment: NEGATIVE
Comment: NEGATIVE
Comment: NORMAL
Diagnosis: NEGATIVE
Neisseria Gonorrhea: NEGATIVE
Trichomonas: NEGATIVE

## 2023-11-25 ENCOUNTER — Telehealth: Payer: Self-pay | Admitting: *Deleted

## 2023-11-25 ENCOUNTER — Other Ambulatory Visit: Payer: Self-pay | Admitting: *Deleted

## 2023-11-25 NOTE — Telephone Encounter (Signed)
error 

## 2023-11-25 NOTE — Telephone Encounter (Signed)
 RTC regarding request for refill. Unclear what medication needs refill. No answer. Left VM and call back number and encouraged call back or MyChart message reply.

## 2023-11-26 ENCOUNTER — Other Ambulatory Visit: Payer: Self-pay | Admitting: *Deleted

## 2023-11-26 DIAGNOSIS — N939 Abnormal uterine and vaginal bleeding, unspecified: Secondary | ICD-10-CM

## 2023-11-26 MED ORDER — MEDROXYPROGESTERONE ACETATE 10 MG PO TABS: 10.0000 mg | ORAL_TABLET | Freq: Every day | ORAL | 0 refills | Status: DC

## 2023-11-26 NOTE — Progress Notes (Signed)
 Refill on Provera sent in today per [facility] approval.  Pt is to be scheduled for f/u appt.

## 2023-11-27 ENCOUNTER — Other Ambulatory Visit

## 2023-12-05 ENCOUNTER — Ambulatory Visit

## 2023-12-12 ENCOUNTER — Encounter: Payer: Self-pay | Admitting: Obstetrics and Gynecology

## 2023-12-12 ENCOUNTER — Ambulatory Visit: Admitting: Obstetrics and Gynecology

## 2023-12-12 VITALS — BP 123/71 | HR 91 | Ht 66.0 in | Wt 219.6 lb

## 2023-12-12 DIAGNOSIS — N939 Abnormal uterine and vaginal bleeding, unspecified: Secondary | ICD-10-CM | POA: Diagnosis not present

## 2023-12-12 DIAGNOSIS — Z113 Encounter for screening for infections with a predominantly sexual mode of transmission: Secondary | ICD-10-CM | POA: Diagnosis not present

## 2023-12-12 NOTE — Progress Notes (Signed)
 SUBJECTIVE:  23 y.o. female present for STD testing. Denies abnormal vaginal bleeding or significant pelvic pain or fever. No UTI symptoms. Denies history of known exposure to STD.  No LMP recorded. (Menstrual status: Irregular Periods).  OBJECTIVE:  She appears well, afebrile. Urine dipstick: not done.  ASSESSMENT:  Vaginal Discharge  Vaginal Odor   PLAN:  GC, chlamydia, trichomonas, BVAG, CVAG probe sent to lab. Treatment: To be determined once lab results are received ROV prn if symptoms persist or worsen.

## 2023-12-12 NOTE — Progress Notes (Deleted)
   GYNECOLOGY PROGRESS NOTE  History:  23 y.o. G2P1101 presents to [facility] for follow up   The following portions of the patient's history were reviewed and updated as appropriate: allergies, current medications, past family history, past medical history, past social history, past surgical history and problem list. Last pap smear on 11/13/23 was normal  Health Maintenance Due  Topic Date Due   HPV VACCINES (1 - 3-dose series) Never done   DTaP/Tdap/Td (1 - Tdap) Never done   COVID-19 Vaccine (1 - 2024-25 season) Never done     Review of Systems:  Pertinent items are noted in HPI.   Objective:  Physical Exam There were no vitals taken for this visit. VS reviewed, nursing note reviewed,  Constitutional: well developed, well nourished, no distress HEENT: normocephalic CV: normal rate Pulm/chest wall: normal effort Breast Exam: deferred Abdomen: soft Neuro: alert and oriented x 3 Skin: warm, dry Psych: affect normal Pelvic exam: Cervix pink, visually closed, without lesion, scant white creamy discharge, vaginal walls and external genitalia normal Bimanual exam: Cervix 0/long/high, firm, anterior, neg CMT, uterus nontender, nonenlarged, adnexa without tenderness, enlargement, or mass  Assessment & Plan:  There are no diagnoses linked to this encounter.  No follow-ups on file.   Susi Eric, FNP 10:15 AM

## 2023-12-16 ENCOUNTER — Other Ambulatory Visit

## 2023-12-16 NOTE — Progress Notes (Signed)
Attestation of visit: Intake and evaluation was performed by the nurse.  I have reviewed the note and the chart, and I agree with the management and plan.   Albertine Grates, FNP [facility] [employer] [facility], [facility]

## 2023-12-30 ENCOUNTER — Other Ambulatory Visit

## 2024-01-21 ENCOUNTER — Other Ambulatory Visit (HOSPITAL_COMMUNITY)
Admission: RE | Admit: 2024-01-21 | Discharge: 2024-01-21 | Disposition: A | Discharge status: Home / Self Care | Source: Ambulatory Visit | Attending: Obstetrics and Gynecology | Admitting: Obstetrics and Gynecology

## 2024-01-21 ENCOUNTER — Other Ambulatory Visit

## 2024-01-21 VITALS — BP 133/86 | HR 89

## 2024-01-21 DIAGNOSIS — Z113 Encounter for screening for infections with a predominantly sexual mode of transmission: Secondary | ICD-10-CM | POA: Insufficient documentation

## 2024-01-21 NOTE — Progress Notes (Signed)
 SUBJECTIVE:  23 y.o. female who desires a STI screen. Denies abnormal vaginal discharge, bleeding or significant pelvic pain. No UTI symptoms. Denies history of known exposure to STD.  No LMP recorded. (Menstrual status: Irregular Periods).  OBJECTIVE:  She appears well.   ASSESSMENT:  STI Screen   PLAN:  Pt offered STI blood screening-requested GC, chlamydia, and trichomonas probe sent to lab.  Treatment: To be determined once lab results are received.  Pt follow up as needed.  Pt would like to get PCOS labs done. Informed pt to schedule a provider visit at check out, and she can have a visit to discuss fertility, and PCOS concerns to evaluate if labs should be done and next steps.   Pt wants to hold off on STD labs until that provider visit.

## 2024-01-22 LAB — CERVICOVAGINAL ANCILLARY ONLY
Bacterial Vaginitis (gardnerella): NEGATIVE
Candida Glabrata: NEGATIVE
Candida Vaginitis: NEGATIVE
Chlamydia: NEGATIVE
Comment: NEGATIVE
Comment: NEGATIVE
Comment: NEGATIVE
Comment: NEGATIVE
Comment: NEGATIVE
Comment: NORMAL
Neisseria Gonorrhea: NEGATIVE
Trichomonas: NEGATIVE

## 2024-01-30 ENCOUNTER — Ambulatory Visit: Admitting: Obstetrics and Gynecology

## 2024-01-30 ENCOUNTER — Ambulatory Visit (HOSPITAL_COMMUNITY)
Admission: EM | Admit: 2024-01-30 | Discharge: 2024-01-30 | Disposition: A | Discharge status: Home / Self Care | Attending: Family Medicine | Admitting: Family Medicine

## 2024-01-30 ENCOUNTER — Encounter (HOSPITAL_COMMUNITY): Payer: Self-pay

## 2024-01-30 ENCOUNTER — Encounter: Payer: Self-pay | Admitting: Obstetrics and Gynecology

## 2024-01-30 VITALS — BP 106/69 | HR 82 | Ht 66.0 in | Wt 212.2 lb

## 2024-01-30 DIAGNOSIS — N911 Secondary amenorrhea: Secondary | ICD-10-CM | POA: Diagnosis not present

## 2024-01-30 DIAGNOSIS — Z113 Encounter for screening for infections with a predominantly sexual mode of transmission: Secondary | ICD-10-CM | POA: Diagnosis not present

## 2024-01-30 DIAGNOSIS — N939 Abnormal uterine and vaginal bleeding, unspecified: Secondary | ICD-10-CM | POA: Insufficient documentation

## 2024-01-30 DIAGNOSIS — H0289 Other specified disorders of eyelid: Secondary | ICD-10-CM

## 2024-01-30 MED ORDER — ERYTHROMYCIN 5 MG/GM OP OINT: TOPICAL_OINTMENT | OPHTHALMIC | 0 refills | Status: AC

## 2024-01-30 MED ORDER — MEDROXYPROGESTERONE ACETATE 5 MG PO TABS: 5.0000 mg | ORAL_TABLET | Freq: Every day | ORAL | 0 refills | Status: DC

## 2024-01-30 NOTE — Progress Notes (Signed)
 Pt has concerns about PCOS. She was seen in office 12-12-23. No period since 11/2023 Needs labs drown from that visit. Wants STD blood work

## 2024-01-30 NOTE — Progress Notes (Signed)
  GYNECOLOGY PROGRESS NOTE  History:  [name] is a 23 y.o. G2P1101 presents to [facility] office today for problem gyn visit. She reports she has not had a period since April 2025 after she finished taking Provera . She would also like STI blood work and an HCG test. She reports that one of her friends was pregnant and "did not know it until the blood test was done and came back positive."  She denies h/a, dizziness, shortness of breath, n/v, or fever/chills.    The following portions of the patient's history were reviewed and updated as appropriate: allergies, current medications, past family history, past medical history, past social history, past surgical history and problem list. Last pap smear on 11/13/2023 was normal.  Review of Systems:  Pertinent items are noted in HPI.   Objective:  Physical Exam Blood pressure 106/69, pulse 82, height 5\' 6"  (1.676 m), weight 212 lb 3.2 oz (96.3 kg). VS reviewed, nursing note reviewed,  Constitutional: well developed, well nourished, no distress HEENT: normocephalic CV: normal rate Pulm/chest wall: normal effort Breast Exam: deferred Abdomen: soft Neuro: alert and oriented x 3 Skin: warm, dry Psych: affect normal Pelvic exam: Cervix pink, visually closed, without lesion, scant white creamy discharge, vaginal walls and external genitalia normal Bimanual exam: Cervix 0/long/high, firm, anterior, neg CMT, uterus nontender, nonenlarged, adnexa without tenderness, enlargement, or mass  Assessment & Plan:  1. Amenorrhea, secondary (Primary) - Move forward the labs that were previously ordered by Susi Eric, FNP, but patient did not have drawn - TSH Rfx on Abnormal to Free T4 - Prolactin - FSH - HgB A1c - Testosterone - Beta HCG - Advised normally UPT would be performed first, but bHCG will be done per her request. Patient agrees. - Prescription for: medroxyPROGESTERone  (PROVERA ) 5 MG tablet; Take 1 tablet (5 mg total) by mouth  daily.  Dispense: 10 tablet; Refill: 0 - Plan to follow-up with S. Bevin Bucks, FNP in 4 wks  2. Screening examination for STI - HIV antibody (with reflex) - RPR - Hepatitis C Antibody - Hepatitis B Surface AntiGEN - Beta hCG quant (ref lab)  Total face-to-face time spent during this encounter was 5 minutes. There was 5 minutes of chart review time spent prior to this encounter. Total time spent = 10 minutes.    Ortha Metts, CNM 11:17 AM

## 2024-01-30 NOTE — ED Provider Notes (Signed)
  [facility]   [id number] 01/30/24 Arrival Time: 1258  ASSESSMENT & PLAN:  1. Irritation of eyelid    Ques forming hordeolum. Discussed. Trial of: Meds ordered this encounter  Medications   erythromycin ophthalmic ointment    Sig: Place a 1/2 inch ribbon of ointment into the lower eyelid 2-3 times daily for the next 5-7 days.    Dispense:  3.5 g    Refill:  0    Follow-up Information     [facility]   Specialty: Urgent Care Why: If worsening or failing to improve as anticipated. Contact information: [address] [place]  [address] [phone]                Reviewed expectations re: course of current medical issues. Questions answered. Outlined signs and symptoms indicating need for more acute intervention. Understanding verbalized. After Visit Summary given.   SUBJECTIVE: History from: Patient. Tammie Byrd is a 23 y.o. female. Patient reports swelling and pain of the right upper eyelid x 2 days. Patient denies any eye drainage. Denies any vision changes/loss. Denies specific eye pain. Denies injury/trauma to eye. Denies: fever. No tx PTA.  OBJECTIVE:  Vitals:   01/30/24 1329  BP: 108/72  Pulse: 84  Resp: 16  Temp: 98.4 F (36.9 C)  SpO2: 97%    General appearance: alert; no distress Eyes: PERRLA; EOMI; conjunctivae normal; upper R eyelid with mild erythema and swelling HENT: [place]; AT Neck: supple  Extremities: no edema Skin: warm and dry  Labs:  Labs Reviewed - No data to display  Imaging: No results found.  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 08/24/21  Treatment/follow up [ ]     Severe preeclampsia 05/02/2017   Social History   Socioeconomic History   Marital status: Single    Spouse name: Not on file   Number of children: Not on file   Years of  education: Not on file   Highest education level: Not on file  Occupational History   Not on file  Tobacco Use   Smoking status: Never    Passive exposure: Never   Smokeless tobacco: Never  Vaping Use   Vaping status: Never Used  Substance and Sexual Activity   Alcohol use: No   Drug use: No   Sexual activity: Yes    Birth control/protection: None  Other Topics Concern   Not on file  Social History Narrative   Not on file   Social Drivers of Health   Financial Resource Strain: Not on file  Food Insecurity: Not on file  Transportation Needs: Not on file  Physical Activity: Not on file  Stress: Not on file  Social Connections: Not on file  Intimate Partner Violence: Not on file   Family History  Problem Relation Age of Onset   Hypertension Father    Cancer Brother    Early death Brother    Cancer Maternal Grandmother    Diabetes Maternal Grandfather    Hypertension Maternal Grandfather    Past Surgical History:  Procedure Laterality Date   TONSILLECTOMY       Afton Albright, MD 01/30/24 [phone]

## 2024-01-30 NOTE — ED Triage Notes (Signed)
 Patient reports swelling and pain of the right upper eyelid x 2 days. Patient denies any eye drainage.

## 2024-02-01 LAB — TSH RFX ON ABNORMAL TO FREE T4: TSH: 1.11 u[IU]/mL (ref 0.450–4.500)

## 2024-02-03 ENCOUNTER — Ambulatory Visit: Payer: Self-pay | Admitting: Obstetrics and Gynecology

## 2024-02-04 LAB — RPR, QUANT+TP ABS (REFLEX)
Rapid Plasma Reagin, Quant: 1:4 {titer} — ABNORMAL HIGH
T Pallidum Abs: REACTIVE — AB

## 2024-02-04 LAB — TESTOSTERONE: Testosterone: 52 ng/dL (ref 13–71)

## 2024-02-04 LAB — HEMOGLOBIN A1C
Est. average glucose Bld gHb Est-mCnc: 123 mg/dL
Hgb A1c MFr Bld: 5.9 % — ABNORMAL HIGH (ref 4.8–5.6)

## 2024-02-04 LAB — HEPATITIS B SURFACE ANTIGEN: Hepatitis B Surface Ag: NEGATIVE

## 2024-02-04 LAB — PROLACTIN: Prolactin: 13 ng/mL (ref 4.8–33.4)

## 2024-02-04 LAB — HIV ANTIBODY (ROUTINE TESTING W REFLEX): HIV Screen 4th Generation wRfx: NONREACTIVE

## 2024-02-04 LAB — FOLLICLE STIMULATING HORMONE: FSH: 6.8 m[IU]/mL

## 2024-02-04 LAB — BETA HCG QUANT (REF LAB): hCG Quant: <1 m[IU]/mL

## 2024-02-04 LAB — RPR: RPR Ser Ql: REACTIVE — AB

## 2024-02-04 LAB — HEPATITIS C ANTIBODY: Hep C Virus Ab: NONREACTIVE

## 2024-02-26 ENCOUNTER — Ambulatory Visit: Admitting: Obstetrics and Gynecology

## 2024-03-19 ENCOUNTER — Ambulatory Visit: Admitting: Obstetrics
